# Patient Record
Sex: Male | Born: 1955 | Race: White | Hispanic: No | Marital: Married | Smoking: Former smoker
Health system: Southern US, Community
[De-identification: ages and names within clinical notes are randomized; demographics above are authoritative.]

## PROBLEM LIST (undated history)

## (undated) DIAGNOSIS — K589 Irritable bowel syndrome without diarrhea: Secondary | ICD-10-CM

## (undated) DIAGNOSIS — K219 Gastro-esophageal reflux disease without esophagitis: Secondary | ICD-10-CM

## (undated) DIAGNOSIS — E785 Hyperlipidemia, unspecified: Secondary | ICD-10-CM

## (undated) DIAGNOSIS — F329 Major depressive disorder, single episode, unspecified: Secondary | ICD-10-CM

## (undated) DIAGNOSIS — A770 Spotted fever due to Rickettsia rickettsii: Secondary | ICD-10-CM

## (undated) DIAGNOSIS — F32A Depression, unspecified: Secondary | ICD-10-CM

## (undated) DIAGNOSIS — D649 Anemia, unspecified: Secondary | ICD-10-CM

## (undated) DIAGNOSIS — I10 Essential (primary) hypertension: Secondary | ICD-10-CM

## (undated) HISTORY — DX: Spotted fever due to Rickettsia rickettsii: A77.0

## (undated) HISTORY — DX: Gastro-esophageal reflux disease without esophagitis: K21.9

## (undated) HISTORY — DX: Hyperlipidemia, unspecified: E78.5

## (undated) HISTORY — DX: Anemia, unspecified: D64.9

## (undated) HISTORY — PX: CHOLECYSTECTOMY: SHX55

## (undated) HISTORY — DX: Irritable bowel syndrome, unspecified: K58.9

---

## 1999-01-31 ENCOUNTER — Emergency Department (HOSPITAL_COMMUNITY): Admission: EM | Admit: 1999-01-31 | Discharge: 1999-01-31 | Payer: Self-pay | Admitting: Emergency Medicine

## 2003-03-24 ENCOUNTER — Encounter: Payer: Self-pay | Admitting: Gastroenterology

## 2003-09-30 ENCOUNTER — Emergency Department (HOSPITAL_COMMUNITY): Admission: EM | Admit: 2003-09-30 | Discharge: 2003-09-30 | Payer: Self-pay

## 2005-10-11 ENCOUNTER — Emergency Department (HOSPITAL_COMMUNITY): Admission: EM | Admit: 2005-10-11 | Discharge: 2005-10-12 | Payer: Self-pay | Admitting: Emergency Medicine

## 2009-05-24 ENCOUNTER — Emergency Department (HOSPITAL_COMMUNITY): Admission: EM | Admit: 2009-05-24 | Discharge: 2009-05-24 | Payer: Self-pay | Admitting: Emergency Medicine

## 2009-05-29 ENCOUNTER — Emergency Department (HOSPITAL_COMMUNITY): Admission: EM | Admit: 2009-05-29 | Discharge: 2009-05-29 | Payer: Self-pay | Admitting: Emergency Medicine

## 2009-05-30 ENCOUNTER — Emergency Department (HOSPITAL_COMMUNITY): Admission: EM | Admit: 2009-05-30 | Discharge: 2009-05-30 | Payer: Self-pay | Admitting: Emergency Medicine

## 2010-03-18 ENCOUNTER — Ambulatory Visit: Payer: Self-pay | Admitting: Physician Assistant

## 2010-03-18 DIAGNOSIS — K219 Gastro-esophageal reflux disease without esophagitis: Secondary | ICD-10-CM

## 2010-03-18 DIAGNOSIS — K589 Irritable bowel syndrome without diarrhea: Secondary | ICD-10-CM | POA: Insufficient documentation

## 2010-03-18 DIAGNOSIS — R252 Cramp and spasm: Secondary | ICD-10-CM

## 2010-03-18 DIAGNOSIS — L290 Pruritus ani: Secondary | ICD-10-CM | POA: Insufficient documentation

## 2010-03-18 DIAGNOSIS — E785 Hyperlipidemia, unspecified: Secondary | ICD-10-CM | POA: Insufficient documentation

## 2010-03-19 ENCOUNTER — Telehealth: Payer: Self-pay | Admitting: Physician Assistant

## 2010-03-19 ENCOUNTER — Encounter: Payer: Self-pay | Admitting: Physician Assistant

## 2010-03-19 LAB — CONVERTED CEMR LAB
ALT: 17 units/L (ref 0–53)
AST: 19 units/L (ref 0–37)
Albumin: 4.8 g/dL (ref 3.5–5.2)
Alkaline Phosphatase: 50 units/L (ref 39–117)
Amphetamine Screen, Ur: NEGATIVE
BUN: 22 mg/dL (ref 6–23)
Barbiturate Quant, Ur: NEGATIVE
Basophils Absolute: 0 10*3/uL (ref 0.0–0.1)
Basophils Relative: 1 % (ref 0–1)
Benzodiazepines.: NEGATIVE
CO2: 25 meq/L (ref 19–32)
Calcium: 10.3 mg/dL (ref 8.4–10.5)
Chloride: 105 meq/L (ref 96–112)
Cholesterol, target level: 200 mg/dL
Cholesterol: 229 mg/dL — ABNORMAL HIGH (ref 0–200)
Cocaine Metabolites: NEGATIVE
Creatinine, Ser: 0.96 mg/dL (ref 0.40–1.50)
Creatinine,U: 133.7 mg/dL
Eosinophils Absolute: 0.2 10*3/uL (ref 0.0–0.7)
Eosinophils Relative: 3 % (ref 0–5)
Glucose, Bld: 89 mg/dL (ref 70–99)
HCT: 44.1 % (ref 39.0–52.0)
HDL goal, serum: 40 mg/dL
HDL: 55 mg/dL (ref >39)
Hemoglobin: 14.4 g/dL (ref 13.0–17.0)
LDL Cholesterol: 147 mg/dL — ABNORMAL HIGH (ref 0–99)
LDL Goal: 160 mg/dL
Lymphocytes Relative: 35 % (ref 12–46)
Lymphs Abs: 2.2 10*3/uL (ref 0.7–4.0)
MCHC: 32.7 g/dL (ref 30.0–36.0)
MCV: 99.5 fL (ref 78.0–100.0)
Marijuana Metabolite: POSITIVE — AB
Methadone: NEGATIVE
Monocytes Absolute: 0.6 10*3/uL (ref 0.1–1.0)
Monocytes Relative: 10 % (ref 3–12)
Neutro Abs: 3.2 10*3/uL (ref 1.7–7.7)
Neutrophils Relative %: 51 % (ref 43–77)
Opiate Screen, Urine: NEGATIVE
PSA: 0.29 ng/mL (ref 0.10–4.00)
Phencyclidine (PCP): NEGATIVE
Platelets: 222 10*3/uL (ref 150–400)
Potassium: 5.3 meq/L (ref 3.5–5.3)
Propoxyphene: NEGATIVE
RBC: 4.43 M/uL (ref 4.22–5.81)
RDW: 12.9 % (ref 11.5–15.5)
Sodium: 141 meq/L (ref 135–145)
TSH: 1.822 microintl units/mL (ref 0.350–4.500)
Total Bilirubin: 0.9 mg/dL (ref 0.3–1.2)
Total CHOL/HDL Ratio: 4.2
Total Protein: 7.4 g/dL (ref 6.0–8.3)
Triglycerides: 137 mg/dL (ref <150)
VLDL: 27 mg/dL (ref 0–40)
WBC: 6.1 10*3/uL (ref 4.0–10.5)

## 2010-03-26 ENCOUNTER — Encounter: Payer: Self-pay | Admitting: Physician Assistant

## 2010-04-08 ENCOUNTER — Ambulatory Visit: Payer: Self-pay | Admitting: Physician Assistant

## 2010-04-08 DIAGNOSIS — M5430 Sciatica, unspecified side: Secondary | ICD-10-CM | POA: Insufficient documentation

## 2010-04-08 DIAGNOSIS — M25569 Pain in unspecified knee: Secondary | ICD-10-CM

## 2010-04-08 DIAGNOSIS — K648 Other hemorrhoids: Secondary | ICD-10-CM | POA: Insufficient documentation

## 2010-04-08 DIAGNOSIS — M25539 Pain in unspecified wrist: Secondary | ICD-10-CM | POA: Insufficient documentation

## 2010-04-08 LAB — CONVERTED CEMR LAB
Bilirubin Urine: NEGATIVE
Blood in Urine, dipstick: NEGATIVE
Glucose, Urine, Semiquant: NEGATIVE
Ketones, urine, test strip: NEGATIVE
Nitrite: NEGATIVE
Protein, U semiquant: NEGATIVE
Rapid HIV Screen: NEGATIVE
Specific Gravity, Urine: 1.025
Urobilinogen, UA: 0.2
WBC Urine, dipstick: NEGATIVE
pH: 5

## 2010-06-08 ENCOUNTER — Emergency Department (HOSPITAL_COMMUNITY): Admission: EM | Admit: 2010-06-08 | Discharge: 2010-06-08 | Payer: Self-pay | Admitting: Emergency Medicine

## 2010-07-11 ENCOUNTER — Ambulatory Visit: Payer: Self-pay | Admitting: Physician Assistant

## 2010-07-11 LAB — CONVERTED CEMR LAB
Bilirubin Urine: NEGATIVE
Blood in Urine, dipstick: NEGATIVE
Glucose, Urine, Semiquant: NEGATIVE
Ketones, urine, test strip: NEGATIVE
Nitrite: NEGATIVE
Protein, U semiquant: NEGATIVE
Specific Gravity, Urine: >=1.03
Urobilinogen, UA: 0.2
WBC Urine, dipstick: NEGATIVE
pH: 5

## 2010-07-17 LAB — CONVERTED CEMR LAB: Tissue Transglutaminase Ab, IgA: 6.7 units (ref <20)

## 2010-08-15 ENCOUNTER — Ambulatory Visit: Payer: Self-pay | Admitting: Physician Assistant

## 2010-09-30 ENCOUNTER — Ambulatory Visit: Payer: Self-pay | Admitting: Nurse Practitioner

## 2010-09-30 ENCOUNTER — Telehealth (INDEPENDENT_AMBULATORY_CARE_PROVIDER_SITE_OTHER): Payer: Self-pay | Admitting: Nurse Practitioner

## 2010-09-30 DIAGNOSIS — F411 Generalized anxiety disorder: Secondary | ICD-10-CM | POA: Insufficient documentation

## 2010-11-06 ENCOUNTER — Encounter (INDEPENDENT_AMBULATORY_CARE_PROVIDER_SITE_OTHER): Payer: Self-pay | Admitting: *Deleted

## 2010-12-17 ENCOUNTER — Encounter (INDEPENDENT_AMBULATORY_CARE_PROVIDER_SITE_OTHER): Payer: Self-pay | Admitting: *Deleted

## 2010-12-17 ENCOUNTER — Other Ambulatory Visit: Payer: Self-pay | Admitting: Gastroenterology

## 2010-12-17 ENCOUNTER — Ambulatory Visit
Admission: RE | Admit: 2010-12-17 | Discharge: 2010-12-17 | Payer: Self-pay | Source: Home / Self Care | Attending: Gastroenterology | Admitting: Gastroenterology

## 2010-12-17 LAB — CBC WITH DIFFERENTIAL/PLATELET
Basophils Absolute: 0 10*3/uL (ref 0.0–0.1)
Basophils Relative: 0.4 % (ref 0.0–3.0)
Eosinophils Absolute: 0.1 10*3/uL (ref 0.0–0.7)
Eosinophils Relative: 1.3 % (ref 0.0–5.0)
HCT: 39.8 % (ref 39.0–52.0)
Hemoglobin: 14 g/dL (ref 13.0–17.0)
Lymphocytes Relative: 37.6 % (ref 12.0–46.0)
Lymphs Abs: 2.5 10*3/uL (ref 0.7–4.0)
MCHC: 35.2 g/dL (ref 30.0–36.0)
MCV: 97.4 fl (ref 78.0–100.0)
Monocytes Absolute: 0.6 10*3/uL (ref 0.1–1.0)
Monocytes Relative: 8.5 % (ref 3.0–12.0)
Neutro Abs: 3.4 10*3/uL (ref 1.4–7.7)
Neutrophils Relative %: 52.2 % (ref 43.0–77.0)
Platelets: 225 10*3/uL (ref 150.0–400.0)
RBC: 4.09 Mil/uL — ABNORMAL LOW (ref 4.22–5.81)
RDW: 12.9 % (ref 11.5–14.6)
WBC: 6.6 10*3/uL (ref 4.5–10.5)

## 2010-12-17 LAB — BASIC METABOLIC PANEL
BUN: 19 mg/dL (ref 6–23)
CO2: 31 mEq/L (ref 19–32)
Calcium: 9.7 mg/dL (ref 8.4–10.5)
Chloride: 106 mEq/L (ref 96–112)
Creatinine, Ser: 1.1 mg/dL (ref 0.4–1.5)
GFR: 74.88 mL/min (ref >60.00)
Glucose, Bld: 88 mg/dL (ref 70–99)
Potassium: 4.5 mEq/L (ref 3.5–5.1)
Sodium: 142 mEq/L (ref 135–145)

## 2010-12-17 LAB — TSH: TSH: 1.88 u[IU]/mL (ref 0.35–5.50)

## 2010-12-18 ENCOUNTER — Encounter: Payer: Self-pay | Admitting: Gastroenterology

## 2010-12-19 ENCOUNTER — Telehealth: Payer: Self-pay | Admitting: Gastroenterology

## 2010-12-19 NOTE — Progress Notes (Signed)
Summary: GI referral  Phone Note Outgoing Call   Summary of Call: schedule pt with GI - indication irritable bowel Initial call taken by: Lehman Prom FNP,  September 30, 2010 10:35 AM  Follow-up for Phone Call        SEND A REFERRAL TO [facility] WAITING FOR AN APPT THEY WILL CONTACT THE PT  Follow-up by: Cheryll Dessert,  September 30, 2010 11:35 AM

## 2010-12-19 NOTE — Letter (Signed)
Summary: New Patient letter  [facility]  [address]   [place], [place] [address]   Phone: [phone]  Fax: [phone]       11/06/2010 MRN: [id number]  Shane Reynolds [address] [place], [place]  [address]  Dear Mr. Schwanz,  Welcome to the Gastroenterology Division at [facility].    You are scheduled to see Dr.  Christella Hartigan on 12/17/2010 at 2:00 on the 3rd floor at [facility], [address]  We ask that you try to arrive at our office 15 minutes prior to your appointment time to allow for check-in.  We would like you to complete the enclosed self-administered evaluation form prior to your visit and bring it with you on the day of your appointment.  We will review it with you.  Also, please bring a complete list of all your medications or, if you prefer, bring the medication bottles and we will list them.  Please bring your insurance card so that we may make a copy of it.  If your insurance requires a referral to see a specialist, please bring your referral form from your primary care physician.  Co-payments are due at the time of your visit and may be paid by cash, check or credit card.     Your office visit will consist of a consult with your physician (includes a physical exam), any laboratory testing he/she may order, scheduling of any necessary diagnostic testing (e.g. x-ray, ultrasound, CT-scan), and scheduling of a procedure (e.g. Endoscopy, Colonoscopy) if required.  Please allow enough time on your schedule to allow for any/all of these possibilities.    If you cannot keep your appointment, please call [phone] to cancel or reschedule prior to your appointment date.  This allows [place] the opportunity to schedule an appointment for another patient in need of care.  If you do not cancel or reschedule by 5 p.m. the business day prior to your appointment date, you will be charged a $50.00 late cancellation/no-show fee.    Thank you for  choosing [facility] for your medical needs.  We appreciate the opportunity to care for you.  Please visit [place] at our website  to learn more about our practice.                     Sincerely,                                                             The Gastroenterology Division

## 2010-12-19 NOTE — Progress Notes (Signed)
Summary: Office Visit//DEPRESSION SCREENING  Office Visit//DEPRESSION SCREENING   Imported By: Arta Bruce 05/27/2010 11:50:09  _____________________________________________________________________  External Attachment:    Type:   Image     Comment:   External Document

## 2010-12-19 NOTE — Progress Notes (Signed)
  Phone Note Call from Patient Call back at [facility] Phone [phone]   Summary of Call: The pt needs to get his lab results done yesterday. Alben Spittle PA-c Initial call taken by: Manon Hilding,  Mar 19, 2010 10:27 AM  Follow-up for Phone Call        spoke with pt and let him know tha labs results has not been looked at by provider but as soon as i know the results we will let him know by phone or letter..... forward to provider Follow-up by: Armenia Shannon,  Mar 19, 2010 10:32 AM  Additional Follow-up for Phone Call Additional follow up Details #1::        all labs are normal total chol high but rest of numbers are good lipid letter done to be mailed Additional Follow-up by: Brynda Rim,  Mar 19, 2010 5:49 PM    Additional Follow-up for Phone Call Additional follow up Details #2::    pt is aware Follow-up by: Armenia Shannon,  Mar 20, 2010 9:39 AM

## 2010-12-19 NOTE — Assessment & Plan Note (Signed)
Summary: NEW MEDICAID TO ESTB//[place]   Vital Signs:  Patient profile:   55 year old male Height:      74 inches Weight:      181 pounds BMI:     23.32 Pulse rate:   52 / minute Pulse rhythm:   regular BP sitting:   127 / 71  (left arm) Cuff size:   large  Vitals Entered By: Armenia Shannon (Mar 18, 2010 11:16 AM) CC: Np.... pt says he has skin problems... pt says its like a rash on his bottom.... pt has not tried anything... pt says his muscles draw up when he exertion Is Patient Diabetic? No Pain Assessment Patient in pain? no       Does patient need assistance? Functional Status Self care Ambulation Normal   Primary Care Provider:  Tereso Newcomer, PA-C  CC:  Np.... pt says he has skin problems... pt says its like a rash on his bottom.... pt has not tried anything... pt says his muscles draw up when he exertion.  History of Present Illness: New patient. Apparently was a patient here some years ago.  Had been told he had Hep C and was referred to [facility].  This was about 6 years ago.  Workup at [facility] was apparently negative. He was seeing Dr. Perrin Maltese previously [facility] [address].  Mainly was seen for IBS.  GERD:  Has been taking Zantac for about 6 mos.  Dr. at Urgent Care put him on it.  May have helped some.  Complains mainly of gas.  Has some constipation and then diarrhea.  Has not used SSRI in the past.  Does try to eat a lot of fiber.  Dyspepsia has improved since using zantac.  Does not have many symptoms.    Also notes rash in perirectal area.  Has some itching noted from time to time.  Feels like there is a bump around his rectum when he scratches.  No bleeding.    Also, notes occ. muscle cramping.  Works [employer] at [employer].  Seems to be worse after working.  Trys to drink plenty of water.  Has increased dyspepsia with Gatorade.    Continues to offer more and more complaints today.  Explained that we would have to address other complaints at a separate visit.   Habits &  Providers  Alcohol-Tobacco-Diet     Alcohol drinks/day: 0     Tobacco Status: quit     Year Quit: 2010     Pack years: 10  Exercise-Depression-Behavior     Drug Use: no  Current Medications (verified): 1)  Zantac 300 Mg Tabs (Ranitidine Hcl) .... One Tab By Mouth Two Times Daily  Allergies (verified): 1)  ! Codeine  Past History:  Past Medical History: Irritable Bowel Syndrome ? h/o + Hep C antibody with negative w/u at [facility] 2005 GERD Hyperlipidemia  a. previously on meds  Past Surgical History: Cholecystectomy (1998) Colonoscopy 2005 [facility])  Family History: Family History Diabetes 1st degree relative - mom Stroke - Dad (died in 40s) Skin Cancer - sister No colon cancer or prostate cancer. 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 . .wife may be pregnant Former Smoker Alcohol use-no Drug use-no Occupation:  employed Smoking Status:  quit Drug Use:  no  Physical Exam  General:  alert, well-developed, and well-nourished.   Head:  normocephalic and atraumatic.   Eyes:  pupils equal, pupils  round, and pupils reactive to light.   Mouth:  pharynx pink and moist.   Neck:  supple, no thyromegaly, and no cervical lymphadenopathy.   Lungs:  normal breath sounds.   Heart:  normal rate and regular rhythm.   Abdomen:  soft, non-tender, normal bowel sounds, and no hepatomegaly.   Rectal:  no external abnormalities and no hemorrhoids.   Extremities:  no edema Neurologic:  alert & oriented X3 and cranial nerves II-XII intact.   Psych:  normally interactive and good eye contact.     Impression & Recommendations:  Problem # 1:  GERD (ICD-530.81) seems controlled rec he change to zantac 150 mg two times a day as needed  His updated medication list for this problem includes:    Zantac 150 Mg Tabs (Ranitidine hcl) .Marland Kitchen... Take 1 tablet by mouth two times a day as needed for  indigestion  Problem # 2:  IRRITABLE BOWEL SYNDROME (ICD-564.1) need copy of colo done at [facility] high fiber handout given trial of miralax   Problem # 3:  HYPERLIPIDEMIA (ICD-272.4)  check lipids today  Orders: T-Comprehensive Metabolic Panel ([address]-60454) T-Lipid Profile (09811-91478)  Problem # 4:  Preventive Health Care (ICD-V70.0)  fasting today get labs discussed PSA and he would like checked  Orders: T-Comprehensive Metabolic Panel (29562-13086) T-CBC w/Diff (57846-96295) T-TSH (28413-24401) T-PSA (02725-36644) T-HIV Antibody  (Reflex) [id number]) T-Syphilis Test (RPR) [phone]) T-Drug Screen-Urine, (single) (51884-16606) Hemoccult Cards -3 specimans (take home) (30160)  Problem # 5:  LEG CRAMPS (ICD-729.82)  likely related to muscle fatigue from his line of work check e-lytes rec stretching exercises could consider at bedtime muscle relaxer as needed  Orders: T-Comprehensive Metabolic Panel (10932-35573) T-TSH (22025-42706)  Problem # 6:  PRURITUS ANI (ICD-698.0) suspect related to irritation from altered bowel habits with IBS anusol as needed  Complete Medication List: 1)  Zantac 150 Mg Tabs (Ranitidine hcl) .... Take 1 tablet by mouth two times a day as needed for indigestion 2)  Miralax Powd (Polyethylene glycol 3350) .... Dissolve one capful in 8 oz glass of water and drink one glass a day 3)  Anusol-hc 2.5 % Crea (Hydrocortisone) .... Apply once daily as needed  Patient Instructions: 1)  Get record of Colonoscopy done at [facility] in 2005 or 2006. 2)  Please schedule a follow-up appointment in 2-3 months with Scott for CPE.  3)  Drink plenty of fluids. 4)  Try to drink Gatorade or Powerade to help with cramps. 5)  Make sure you do stretching excercises before and after work to help with cramps. 6)  Use Anusol as needed. 7)  Use Zantac as needed. 8)  Try Miralax to see if this helps with your bowels. Prescriptions: ANUSOL-HC 2.5 % CREA  (HYDROCORTISONE) apply once daily as needed  #30 grams x 1   Entered and Authorized by:   Tereso Newcomer PA-C   Signed by:   Tereso Newcomer PA-C on 03/18/2010   Method used:   Print then Give to Patient   RxID:   [id number] MIRALAX  POWD (POLYETHYLENE GLYCOL 3350) dissolve one capful in 8 oz glass of water and drink one glass a day  #1 bottle x 4   Entered and Authorized by:   Tereso Newcomer PA-C   Signed by:   Tereso Newcomer PA-C on 03/18/2010   Method used:   Print then Give to Patient   RxID:   [id number] ZANTAC 150 MG TABS (RANITIDINE HCL) Take 1 tablet by mouth two times a day as needed  for indigestion  #60 x 3   Entered and Authorized by:   Tereso Newcomer PA-C   Signed by:   Tereso Newcomer PA-C on 03/18/2010   Method used:   Print then Give to Patient   RxID:   [id number]

## 2010-12-19 NOTE — Letter (Signed)
Summary: Handout Printed  Printed Handout:  - Diet - High-Fiber 

## 2010-12-19 NOTE — Assessment & Plan Note (Signed)
Summary: GERD   Vital Signs:  Patient profile:   55 year old male Height:      74 inches Weight:      190.3 pounds BMI:     24.52 Temp:     98.1 degrees F oral Pulse rate:   66 / minute Pulse rhythm:   regular Resp:     18 per minute BP sitting:   130 / 78  (left arm) Cuff size:   large  Vitals Entered By: Armenia Shannon (August 15, 2010 9:04 AM) CC: f/u... pt wants flu shot.., Abdominal Pain Is Patient Diabetic? No Pain Assessment Patient in pain? no       Does patient need assistance? Functional Status Self care Ambulation Normal   Primary Care Dhwani Venkatesh:  Tereso Newcomer, PA-C  CC:  f/u... pt wants flu shot.. and Abdominal Pain.  History of Present Illness: Here for f/u. Not using Levsin.  Cramping and diarrhea better with PPI. Less indigestion.  Still having some breakthrough symptoms and using pepton 2x a week.   Dyspepsia History:      He has no alarm features of dyspepsia including no history of melena, hematochezia, dysphagia, persistent vomiting, or involuntary weight loss > 5%.  There is a prior history of GERD.  The patient does not have a prior history of documented ulcer disease.  He has no history of a positive H. Pylori serology.  No previous upper endoscopy has been done.     Current Medications (verified): 1)  Miralax  Powd (Polyethylene Glycol 3350) .... Dissolve One Capful in 8 Oz Glass of Water and Drink One Glass A Day 2)  Anusol-Hc 2.5 % Crea (Hydrocortisone) .... Apply Once Daily As Needed 3)  Levsin/sl 0.125 Mg Subl (Hyoscyamine Sulfate) .... One Under Tongue Every 4 Hours As Needed For Colon Spasm or Diarrhea 4)  Nexium 40 Mg Cpdr (Esomeprazole Magnesium) .... Take 1 Tablet By Mouth Once A Day  Allergies (verified): 1)  ! Codeine  Past History:  Past Medical History: Reviewed history from 04/08/2010 and no changes required. Irritable Bowel Syndrome h/o + Hep C antibody with negative w/u at [facility] 2005   a.  viral load negative at that  time. . . no further w/u needed GERD Hyperlipidemia  a. previously on meds H/o genital herpes  Past Surgical History: Reviewed history from 03/18/2010 and no changes required. Cholecystectomy (1998) Colonoscopy 2005 [facility])  Physical Exam  General:  alert, well-developed, and well-nourished.   Head:  normocephalic and atraumatic.   Neck:  supple.   Lungs:  normal breath sounds.   Heart:  normal rate and regular rhythm.   Abdomen:  soft, normal bowel sounds, and epigastric tenderness.   Neurologic:  alert & oriented X3 and cranial nerves II-XII intact.   Psych:  good eye contact.     Impression & Recommendations:  Problem # 1:  GERD (ICD-530.81) Assessment Improved still having breakthrough symptoms 2x a week taking pepto increase PPI to two times a day  f/u 6-8 weeks if still having breakthrough symptoms will need to refer to GI  His updated medication list for this problem includes:    Levsin/sl 0.125 Mg Subl (Hyoscyamine sulfate) ..... One under tongue every 4 hours as needed for colon spasm or diarrhea    Nexium 40 Mg Cpdr (Esomeprazole magnesium) .Marland Kitchen... Take 1 tablet by mouth two times a day (dose increased)  Problem # 2:  IRRITABLE BOWEL SYNDROME (ICD-564.1) Assessment: Improved not even taking levsin seems to be  better with PPI  Problem # 3:  PREVENTIVE HEALTH CARE (ICD-V70.0) flu shot today  Complete Medication List: 1)  Miralax Powd (Polyethylene glycol 3350) .... Dissolve one capful in 8 oz glass of water and drink one glass a day 2)  Anusol-hc 2.5 % Crea (Hydrocortisone) .... Apply once daily as needed 3)  Levsin/sl 0.125 Mg Subl (Hyoscyamine sulfate) .... One under tongue every 4 hours as needed for colon spasm or diarrhea 4)  Nexium 40 Mg Cpdr (Esomeprazole magnesium) .... Take 1 tablet by mouth two times a day (dose increased)  Patient Instructions: 1)  Increase Nexium to two times a day. 2)  Schedule follow up in 6-8 weeks for stomach. 3)   Return sooner is symptoms get worse. 4)  Flu shot today. Prescriptions: NEXIUM 40 MG CPDR (ESOMEPRAZOLE MAGNESIUM) Take 1 tablet by mouth two times a day (dose increased)  #60 x 4   Entered and Authorized by:   Tereso Newcomer PA-C   Signed by:   Tereso Newcomer PA-C on 08/15/2010   Method used:   Print then Give to Patient   RxID:   [id number]

## 2010-12-19 NOTE — Letter (Signed)
Summary: Lipid Letter  [facility]  [address] [place], [place] [address]   Phone: [phone]  Fax: [phone]    03/19/2010  Shane Reynolds [address] [place], [place]  [address]  Dear Shane Reynolds:  Here are your lipid results:    Cholesterol:       229     Goal: <200   HDL "good" Cholesterol:   55     Goal: >40   LDL "bad" Cholesterol:   147     Goal: <160   Triglycerides:       137     Goal: <150   All numbers at goal except the total cholesterol.  See the recommendations below.  We will need to check your cholesterol again in one year.    TLC Diet (Therapeutic Lifestyle Change): Saturated Fats & Transfatty acids should be kept < 7% of total calories ***Reduce Saturated Fats Polyunstaurated Fat can be up to 10% of total calories Monounsaturated Fat Fat can be up to 20% of total calories Total Fat should be no greater than 25-35% of total calories Carbohydrates should be 50-60% of total calories Protein should be approximately 15% of total calories Fiber should be at least 20-30 grams a day ***Increased fiber may help lower LDL Total Cholesterol should be < 200mg /day Consider adding plant stanol/sterols to diet (example: Benacol spread) ***A higher intake of unsaturated fat may reduce Triglycerides and Increase HDL    Adjunctive Measures (may lower LIPIDS and reduce risk of Heart Attack) include: Aerobic Exercise (20-30 minutes 3-4 times a week) Limit Alcohol Consumption Weight Reduction Dietary Fiber 20-30 grams a day by mouth    Current Medications: 1)    Zantac 150 Mg Tabs (Ranitidine hcl) .... Take 1 tablet by mouth two times a day as needed for indigestion 2)    Miralax  Powd (Polyethylene glycol 3350) .... Dissolve one capful in 8 oz glass of water and drink one glass a day 3)    Anusol-hc 2.5 % Crea (Hydrocortisone) .... Apply once daily as needed  If you have any questions, please call.   Sincerely,    Tereso Newcomer, PA-C Tereso Newcomer PA-C

## 2010-12-19 NOTE — Miscellaneous (Signed)
  Clinical Lists Changes  Observations: Added new observation of PAST MED HX: Irritable Bowel Syndrome h/o + Hep C antibody with negative w/u at [facility] 2005   a.  viral load negative at that time. . . no further w/u needed GERD Hyperlipidemia  a. previously on meds (04/08/2010 13:39) Added new observation of COLONRECACT: Repeat colonoscopy in 10 years.   (03/24/2003 13:40) Added new observation of COLONOSCOPY:  Results: Hemorrhoids.      (03/24/2003 13:40)      Colonoscopy  Procedure date:  03/24/2003  Findings:       Results: Hemorrhoids.       Comments:      Repeat colonoscopy in 10 years.      Past History:  Past Medical History: Irritable Bowel Syndrome h/o + Hep C antibody with negative w/u at [facility] 2005   a.  viral load negative at that time. . . no further w/u needed GERD Hyperlipidemia  a. previously on meds

## 2010-12-19 NOTE — Letter (Signed)
Summary: REQUESTING RECORDS FROM [facility]  REQUESTING RECORDS FROM [facility]   Imported By: Arta Bruce 04/08/2010 12:30:32  _____________________________________________________________________  External Attachment:    Type:   Image     Comment:   External Document

## 2010-12-19 NOTE — Assessment & Plan Note (Signed)
Summary: Abdominal problems   Vital Signs:  Patient profile:   55 year old male Weight:      182.5 pounds BMI:     23.52 Temp:     97.1 degrees F oral Pulse rate:   70 / minute Pulse rhythm:   regular Resp:     16 per minute BP sitting:   140 / 94  (left arm) Cuff size:   large  Vitals Entered By: Levon Hedger (September 30, 2010 10:00 AM) CC: stomach issue with diarrhea causing low back pain...wants to know symptoms of Chron's, Abdominal Pain Is Patient Diabetic? No Pain Assessment Patient in pain? yes     Location: stomach Intensity: 2-3 Type: dull, aching  Does patient need assistance? Functional Status Self care Ambulation Normal Comments pt states he is taking Nexium once a day.   Primary Care Provider:  Tereso Newcomer, PA-C  CC:  stomach issue with diarrhea causing low back pain...wants to know symptoms of Chron's and Abdominal Pain.  History of Present Illness:  Pt into the office still with f/u on abdominal pain. Previous provider was [facility]. Pt was advised to start taking nexium two times a day.  However he has only been taking before supper as he only eats once per day. Pt needs clarity as to what nexium is for and why he needs to take it Ongoing problems with stomach - intermittent.  "I am normal for a couple of day then loose bowels and sometimes constipation. Decrease energy  Bil leg cramps  Takes metamucil two times a day for fiber supplement no caffiene no carbonated drinks No starchy foods.  Colonscopy - last done in 2004; normal results per pt  Dyspepsia History:      He has no alarm features of dyspepsia including no history of melena, hematochezia, dysphagia, persistent vomiting, or involuntary weight loss > 5%.  There is a prior history of GERD.  The patient does not have a prior history of documented ulcer disease.  The dominant symptom is heartburn or acid reflux.  An H-2 blocker medication is not currently being taken.  No previous  upper endoscopy has been done.     Habits & Providers  Alcohol-Tobacco-Diet     Alcohol drinks/day: 0     Tobacco Status: quit     Tobacco Counseling: to remain off tobacco products     Year Quit: 2010     Pack years: 10  Exercise-Depression-Behavior     Drug Use: no  Allergies (verified): 1)  ! Codeine  Review of Systems General:  Complains of weakness. CV:  Denies chest pain or discomfort. Resp:  Denies cough. GI:  Complains of constipation, diarrhea, and indigestion; denies nausea and vomiting; intermittent symptoms. MS:  Complains of muscle and cramps; right leg. Psych:  Complains of anxiety.  Physical Exam  General:  alert.   Head:  normocephalic.   Lungs:  normal breath sounds.   Heart:  normal rate and regular rhythm.   Neurologic:  alert & oriented X3.   Skin:  color normal.   Psych:  Oriented X3.     Impression & Recommendations:  Problem # 1:  IRRITABLE BOWEL SYNDROME (ICD-564.1)  diet restrictions are still not helpful will refer to GI  Orders: Gastroenterology Referral (GI)  Problem # 2:  GERD (ICD-530.81)  advised pt to take nexium in the morning small meals per day His updated medication list for this problem includes:    Levsin/sl 0.125 Mg Subl (Hyoscyamine sulfate) .Marland KitchenMarland KitchenMarland KitchenMarland Kitchen  One under tongue every 4 hours as needed for colon spasm or diarrhea    Nexium 40 Mg Cpdr (Esomeprazole magnesium) .Marland Kitchen... Take 1 tablet by mouth two times a day (dose increased)  Orders: Gastroenterology Referral (GI)  Problem # 3:  ANXIETY (ICD-300.00) advise pt that if GI f/u is normal then he may need to consider starting medications for anxiety  Complete Medication List: 1)  Miralax Powd (Polyethylene glycol 3350) .... Dissolve one capful in 8 oz glass of water and drink one glass a day 2)  Anusol-hc 2.5 % Crea (Hydrocortisone) .... Apply once daily as needed 3)  Levsin/sl 0.125 Mg Subl (Hyoscyamine sulfate) .... One under tongue every 4 hours as needed for colon spasm  or diarrhea 4)  Nexium 40 Mg Cpdr (Esomeprazole magnesium) .... Take 1 tablet by mouth two times a day (dose increased) 5)  Amitiza 8 Mcg Caps (Lubiprostone) .... One capsule by mouth two times a day  Patient Instructions: 1)  Nexium - take this in the morning - even though you don't eat until te afternoon.   2)  You will be referred to GI for follow up on bowel problems. 3)  Start amitiza by mouth two times a day (with a small amount of low fat food) 4)  Keep in the back of your mind that anxiety may be causing symptoms 5)  Read handouts on chrohns and diverticulosis Prescriptions: AMITIZA 8 MCG CAPS (LUBIPROSTONE) ONe capsule by mouth two times a day  #16 x 0   Entered and Authorized by:   Lehman Prom FNP   Signed by:   Lehman Prom FNP on 09/30/2010   Method used:   Samples Given   RxID:   [id number]    Orders Added: 1)  Est. Patient Level III [[address]] 2)  Gastroenterology Referral [GI]

## 2010-12-19 NOTE — Assessment & Plan Note (Signed)
Summary: FU FOR CPE////KT   Vital Signs:  Patient profile:   55 year old male Height:      74 inches Weight:      183 pounds BMI:     23.58 Temp:     98.4 degrees F oral Pulse rate:   50 / minute Pulse rhythm:   regular Resp:     18 per minute BP sitting:   114 / 75  (left arm) Cuff size:   large  Vitals Entered By: Armenia Shannon (Apr 08, 2010 2:46 PM) CC: cpe... pt says he has pain in his right leg and hand.. Is Patient Diabetic? No Pain Assessment Patient in pain? no       Does patient need assistance? Functional Status Self care Ambulation Normal   Primary Care Provider:  Tereso Newcomer, PA-C  CC:  cpe... pt says he has pain in his right leg and hand...  History of Present Illness: Here for CPE.  Had labs at last visit.  All labs ok except total chol somewhat elevated.  Lipid letter sent.  Never rec'd. Reviewed prior chart. Had colo in 2004.  Needs repeat in 2014. PSA was ok. Hep C workup at [facility] was notable for negative viral load.  No further w/u rec. PHQ9=3 today.  Notes several tick bites through the years.  Wants to be checked for Lyme disease.  Has bilateral knee pain.  Has had for 4-5 years.  Stiff in the am.  No significant change with weather.   Also, notes wrist pain on right.  Has had for 4-5 days.  Works in Aeronautical engineer.  Does not think he injured it.  Notes some decreased range of motion with limited wrist extension.   Problems Prior to Update: 1)  Sciatica  (ICD-724.3) 2)  Tick Bite  (ICD-E906.4) 3)  Hemorrhoids, Internal  (ICD-455.0) 4)  Knee Pain, Bilateral  (ICD-719.46) 5)  Wrist Pain, Right  (ICD-719.43) 6)  Preventive Health Care  (ICD-V70.0) 7)  Pruritus Ani  (ICD-698.0) 8)  Leg Cramps  (ICD-729.82) 9)  Family History of Alcoholism/addiction  (ICD-V61.41) 10)  Family History Diabetes 1st Degree Relative  (ICD-V18.0) 11)  Hyperlipidemia  (ICD-272.4) 12)  Gerd  (ICD-530.81) 13)  Irritable Bowel Syndrome  (ICD-564.1)  Current  Medications (verified): 1)  Zantac 150 Mg Tabs (Ranitidine Hcl) .... Take 1 Tablet By Mouth Two Times A Day As Needed For Indigestion 2)  Miralax  Powd (Polyethylene Glycol 3350) .... Dissolve One Capful in 8 Oz Glass of Water and Drink One Glass A Day 3)  Anusol-Hc 2.5 % Crea (Hydrocortisone) .... Apply Once Daily As Needed  Allergies (verified): 1)  ! Codeine  Past History:  Past Medical History: Irritable Bowel Syndrome h/o + Hep C antibody with negative w/u at [facility] 2005   a.  viral load negative at that time. . . no further w/u needed GERD Hyperlipidemia  a. previously on meds H/o genital herpes  Family History: Reviewed history from 03/18/2010 and no changes required. Family History Diabetes 1st degree relative - mom Stroke - Dad (died in 21s) Skin Cancer - sister No colon cancer or prostate cancer. Family History of Alcoholism/Addiction - mom  Social History: Reviewed history from 03/18/2010 and no changes required. 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 . .wife may be pregnant Former Smoker Alcohol use-no Drug use-no  Review of Systems      See HPI General:  Denies chills and fever. Resp:  Denies cough. GI:  Denies  dark tarry stools; has occ hemorrhoidal bleeding; has noted since his colo in 2004 occ. and is assoc with straining and constipation. GU:  Denies dysuria, hematuria, nocturia, and urinary frequency. MS:  See HPI. Derm:  Denies rash. Psych:  Denies suicidal thoughts/plans.  Physical Exam  General:  alert, well-developed, and well-nourished.   Head:  normocephalic and atraumatic.   Eyes:  pupils equal, pupils round, pupils reactive to light, and no retinal abnormalitiies.   Ears:  R ear normal and L ear normal.   Nose:  no external deformity.   Mouth:  pharynx pink and moist.   Neck:  supple, no thyromegaly, no carotid bruits, and no cervical lymphadenopathy.   Chest Wall:  no deformities.   Lungs:  normal breath sounds, no  crackles, and no wheezes.   Heart:  normal rate, regular rhythm, and no murmur.   Abdomen:  soft, non-tender, normal bowel sounds, and no hepatomegaly.   Rectal:  no external abnormalities.   Genitalia:  circumcised, no hydrocele, no varicocele, no scrotal masses, no testicular masses or atrophy, no cutaneous lesions, and no urethral discharge.   Prostate:  no gland enlargement, no nodules, no asymmetry, and no induration.   Msk:  right wrist with slightly limited extension no crepitus no deformity  bilat knees: + crepitus L>R no eff neg McMurray neg ant drawer neg lachman  Extremities:  no edema Neurologic:  alert & oriented X3 and cranial nerves II-XII intact.   Skin:  turgor normal.   Psych:  normally interactive and good eye contact.     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 . . will pursue xrays at that point  Problem # 3:  HEMORRHOIDS, INTERNAL (ICD-455.0) has occ hemorrhoidal bleeding has noted since he had his colo in 2004 no significant change continue to increase fiber and take miralax as needed use supp as needed  Problem # 4:  PREVENTIVE HEALTH CARE (ICD-V70.0)  up to date on all health maint areas  Orders: Hemoccult Cards -3 specimans (take home) (16109) Hemoccult Guaiac-1 spec.(in office) (82270)  Problem # 5:  TICK BITE (ICD-E906.4)  concerned about Lyme disease has had arthralgias as noted above that seem to be related to his line of work no rashes documented will get Lyme Ab  Orders: T-Lyme Disease ([id number])  Problem # 6:  GERD  (ICD-530.81) thinks zantac 300 two times a day was better he can increase to 300 two times a day as needed  His updated medication list for this problem includes:    Zantac 150 Mg Tabs (Ranitidine hcl) .Marland Kitchen... Take 1 tablet by mouth two times a day as needed for indigestion  Problem # 7:  SCIATICA (ICD-724.3) he notes this as well happens when sitting and relieves when he stands up   Complete Medication List: 1)  Zantac 150 Mg Tabs (Ranitidine hcl) .... Take 1 tablet by mouth two times a day as needed for indigestion 2)  Miralax Powd (Polyethylene glycol 3350) .... Dissolve one capful in 8 oz glass of water and drink one glass a day 3)  Anusol-hc 2.5 % Crea (Hydrocortisone) .Marland KitchenMarland KitchenMarland Kitchen  Apply once daily as needed  Patient Instructions: 1)  Take 650 - 1000 mg of tylenol every 4-6 hours as needed for relief of pain or comfort of fever. Avoid taking more than 4000 mg in a 24 hour period( can cause liver damage in higher doses).  2)  Tylenol is pretty effective for arthritis.  You may even try to take 1000 mg two times a day every day to prevent pain and take Ibuprofen in between.  Make sure you take ibuprofen with food. 3)  You can take Ibuprofen 600-800 mg three times a day for 3 days for your hip and wrist pain.  Take it with food. 4)  Get the xray for your wrist. 5)  You can put ice on it two times a day to three times a day. 6)  Try to take a few days off from work to rest your wrist. 7)  Follow up if your wrist does not get better. 8)  You should try to stay active for your knees. 9)  If your knee pain gets worse, schedule a follow up. 10)  If you have more bleeding that is unusual, follow up sooner. 11)  Continue to eat more fiber. 12)  Please schedule a follow-up appointment in 1 year with Elliemae Braman for CPE or sooner as needed.   Laboratory Results   Urine Tests  Date/Time Received: Apr 08, 2010 5:53 PM   Routine Urinalysis   Glucose: negative   (Normal Range: Negative) Bilirubin:  negative   (Normal Range: Negative) Ketone: negative   (Normal Range: Negative) Spec. Gravity: 1.025   (Normal Range: 1.003-1.035) Blood: negative   (Normal Range: Negative) pH: 5.0   (Normal Range: 5.0-8.0) Protein: negative   (Normal Range: Negative) Urobilinogen: 0.2   (Normal Range: 0-1) Nitrite: negative   (Normal Range: Negative) Leukocyte Esterace: negative   (Normal Range: Negative)    Date/Time Received: Mar 18, 2010 2:50 PM   Other Tests  Rapid HIV: negative

## 2010-12-19 NOTE — Assessment & Plan Note (Signed)
Summary: STOMACH ISSUE///KT   Vital Signs:  Patient profile:   55 year old male Height:      74 inches Weight:      186 pounds BMI:     23.97 Temp:     97.3 degrees F oral Pulse rate:   53 / minute Pulse rhythm:   regular Resp:     20 per minute BP sitting:   137 / 80  (left arm)  Vitals Entered By: Armenia Shannon (July 11, 2010 10:48 AM)  Primary Care Provider:  Tereso Newcomer, PA-C  CC:  pt went to er for stomach issues... pt says the zantac is not helping... pt says he is using metamuci.... pt says he wants inforamtion about the shingles vaccine....  History of Present Illness: Here for stomach problems. Taking metamucil two times a day and this keeps him regular. Will have normal bowel movements for a few days.  Then has sudden onset of loose stools and gas.  Has had this problem for years.  Had colo in 2004 at [facility].  Had hem's and rec to have repeat in 2014. Has a h/o IBS.  PHQ9 score was low at CPE in May 2011. Notes problems with indigestion.  Has 2-3 times a week.  No dysphagia.  No melena or hematochezia.   No weight loss.  No fevers or chills.  Has some leg pain when he gets diarrhea.   No caffeine, no cigs.  No spicy foods.  Tries to eat whole wheat and grains.   Went to ED in July with nausea and was dx with heat exhaustion.  Had normal CBC.  Also had lumbar spine xray for complaints of low back pain.   Habits & Providers  Exercise-Depression-Behavior     Have you felt down or hopeless? no     Have you felt little pleasure in things? no  Current Medications (verified): 1)  Zantac 150 Mg Tabs (Ranitidine Hcl) .... Take 1 Tablet By Mouth Two Times A Day As Needed For Indigestion 2)  Miralax  Powd (Polyethylene Glycol 3350) .... Dissolve One Capful in 8 Oz Glass of Water and Drink One Glass A Day 3)  Anusol-Hc 2.5 % Crea (Hydrocortisone) .... Apply Once Daily As Needed  Allergies (verified): 1)  ! Codeine  Past History:  Past Medical History: Last updated:  04/08/2010 Irritable Bowel Syndrome h/o + Hep C antibody with negative w/u at [facility] 2005   a.  viral load negative at that time. . . no further w/u needed GERD Hyperlipidemia  a. previously on meds H/o genital herpes  Physical Exam  General:  alert, well-developed, and well-nourished.   Head:  normocephalic and atraumatic.   Neck:  supple.   Lungs:  normal breath sounds, no crackles, and no wheezes.   Heart:  normal rate, regular rhythm, and no murmur.   Abdomen:  soft, normal bowel sounds, no hepatomegaly, and epigastric tenderness.   Neurologic:  alert & oriented X3 and cranial nerves II-XII intact.   Psych:  normally interactive.     Impression & Recommendations:  Problem # 1:  IRRITABLE BOWEL SYNDROME (ICD-564.1)  mainly has diarrhea denies significant depression relates low back pain and abdominal pain to his symptoms of diarrhea (?spasm) had xrays in ED in July that demonstrated DDD had colo in 2004 and has had these symptoms for years check for celiac sprue cut back on metamucil - ? if he is eating too much fiber try Levsin as needed  Orders: T-Tissue Transglutamase  Ab IgA ([id number])  Problem # 2:  GERD (ICD-530.81)  seems to be having more symptoms  ? related to above will get H. pylori IgM and treat if pos change to Nexium (d/c zantac)  The following medications were removed from the medication list:    Zantac 150 Mg Tabs (Ranitidine hcl) .Marland Kitchen... Take 1 tablet by mouth two times a day as needed for indigestion His updated medication list for this problem includes:    Levsin/sl 0.125 Mg Subl (Hyoscyamine sulfate) ..... One under tongue every 4 hours as needed for colon spasm or diarrhea    Nexium 40 Mg Cpdr (Esomeprazole magnesium) .Marland Kitchen... Take 1 tablet by mouth once a day  Orders: T-H Pylori Antibody IgM ([id number])  Complete Medication List: 1)  Miralax Powd (Polyethylene glycol 3350) .... Dissolve one capful in 8 oz glass of water and drink one glass a  day 2)  Anusol-hc 2.5 % Crea (Hydrocortisone) .... Apply once daily as needed 3)  Levsin/sl 0.125 Mg Subl (Hyoscyamine sulfate) .... One under tongue every 4 hours as needed for colon spasm or diarrhea 4)  Nexium 40 Mg Cpdr (Esomeprazole magnesium) .... Take 1 tablet by mouth once a day  Patient Instructions: 1)  Cut back to once daily on the metamucil.  Take two times a day if needed for constipation. 2)  Drink plenty of water.  Drink 6-8 glasses of water a day. 3)  Avoid spicy foods, caffeine, etc. 4)  Try the Levsin as needed for cramping and diarrhea.  You can take it beforehand if you know of a certain situation that will cause you diarrhea.  Side effects include dry mouth and drowsiness. 5)  Please schedule a follow-up appointment in 4-6 weeks with Scott for stomach.  Prescriptions: NEXIUM 40 MG CPDR (ESOMEPRAZOLE MAGNESIUM) Take 1 tablet by mouth once a day  #30 x 2   Entered and Authorized by:   Tereso Newcomer PA-C   Signed by:   Tereso Newcomer PA-C on 07/11/2010   Method used:   Print then Give to Patient   RxID:   [id number] LEVSIN/SL 0.125 MG SUBL (HYOSCYAMINE SULFATE) one under tongue every 4 hours as needed for colon spasm or diarrhea  #20 x 2   Entered and Authorized by:   Tereso Newcomer PA-C   Signed by:   Tereso Newcomer PA-C on 07/11/2010   Method used:   Print then Give to Patient   RxID:   [id number]   Laboratory Results   Urine Tests    Routine Urinalysis   Glucose: negative   (Normal Range: Negative) Bilirubin: negative   (Normal Range: Negative) Ketone: negative   (Normal Range: Negative) Spec. Gravity: >=1.030   (Normal Range: 1.003-1.035) Blood: negative   (Normal Range: Negative) pH: 5.0   (Normal Range: 5.0-8.0) Protein: negative   (Normal Range: Negative) Urobilinogen: 0.2   (Normal Range: 0-1) Nitrite: negative   (Normal Range: Negative) Leukocyte Esterace: negative   (Normal Range: Negative)        X-ray  Procedure date:   06/08/2010  Findings:      Exam Type: lumbar spine Results: Findings: Normal lumbar alignment.  Mild anterior wedging of L1   appears chronic.  There is disc degeneration with disc space   narrowing and anterior spurring at L1-2, L3-4, and L4-5.  There is   disc space narrowing at L5-S1.  Negative for pars defect.    IMPRESSION:   Lumbar disc degeneration.  No acute bony abnormality.

## 2010-12-24 ENCOUNTER — Other Ambulatory Visit (AMBULATORY_SURGERY_CENTER): Payer: Medicaid Other | Admitting: Gastroenterology

## 2010-12-24 ENCOUNTER — Other Ambulatory Visit: Payer: Self-pay | Admitting: Gastroenterology

## 2010-12-24 DIAGNOSIS — D126 Benign neoplasm of colon, unspecified: Secondary | ICD-10-CM

## 2010-12-24 DIAGNOSIS — K5900 Constipation, unspecified: Secondary | ICD-10-CM

## 2010-12-25 NOTE — Letter (Signed)
Summary: [facility] Instructions  [facility]  [address] [place], [place] [address]   Phone: [phone]  Fax: [phone]       Shane Reynolds    04-16-1956    MRN: [id number]        Procedure Day /Date:12/24/10 TUE     Arrival Time:730 am     Procedure Time:830 am     Location of Procedure:                    X  [facility] (4th Floor)                        PREPARATION FOR COLONOSCOPY WITH MOVIPREP   Starting 5 days prior to your procedure 12/19/10 do not eat nuts, seeds, popcorn, corn, beans, peas,  salads, or any raw vegetables.  Do not take any fiber supplements (e.g. Metamucil, Citrucel, and Benefiber).  THE DAY BEFORE YOUR PROCEDURE         DATE:12/23/10  DAY:MON  1.  Drink clear liquids the entire day-NO SOLID FOOD  2.  Do not drink anything colored red or purple.  Avoid juices with pulp.  No orange juice.  3.  Drink at least 64 oz. (8 glasses) of fluid/clear liquids during the day to prevent dehydration and help the prep work efficiently.  CLEAR LIQUIDS INCLUDE: Water Jello Ice Popsicles Tea (sugar ok, no milk/cream) Powdered fruit flavored drinks Coffee (sugar ok, no milk/cream) Gatorade Juice: apple, white grape, white cranberry  Lemonade Clear bullion, consomm, broth Carbonated beverages (any kind) Strained chicken noodle soup Hard Candy                             4.  In the morning, mix first dose of MoviPrep solution:    Empty 1 Pouch A and 1 Pouch B into the disposable container    Add lukewarm drinking water to the top line of the container. Mix to dissolve    Refrigerate (mixed solution should be used within 24 hrs)  5.  Begin drinking the prep at 5:00 p.m. The MoviPrep container is divided by 4 marks.   Every 15 minutes drink the solution down to the next mark (approximately 8 oz) until the full liter is complete.   6.  Follow completed prep with 16 oz of clear liquid of your choice (Nothing red or purple).   Continue to drink clear liquids until bedtime.  7.  Before going to bed, mix second dose of MoviPrep solution:    Empty 1 Pouch A and 1 Pouch B into the disposable container    Add lukewarm drinking water to the top line of the container. Mix to dissolve    Refrigerate  THE DAY OF YOUR PROCEDURE      DATE: 12/24/10 DAY: TUE  Beginning at 330 a.m. (5 hours before procedure):         1. Every 15 minutes, drink the solution down to the next mark (approx 8 oz) until the full liter is complete.  2. Follow completed prep with 16 oz. of clear liquid of your choice.    3. You may drink clear liquids until 630 am (2 HOURS BEFORE PROCEDURE).   MEDICATION INSTRUCTIONS  Unless otherwise instructed, you should take regular prescription medications with a small sip of water   as early as possible the morning of your procedure.  OTHER INSTRUCTIONS  You will need a responsible adult at least 57 years of age to accompany you and drive you home.   This person must remain in the waiting room during your procedure.  Wear loose fitting clothing that is easily removed.  Leave jewelry and other valuables at home.  However, you may wish to bring a book to read or  an iPod/MP3 player to listen to music as you wait for your procedure to start.  Remove all body piercing jewelry and leave at home.  Total time from sign-in until discharge is approximately 2-3 hours.  You should go home directly after your procedure and rest.  You can resume normal activities the  day after your procedure.  The day of your procedure you should not:   Drive   Make legal decisions   Operate machinery   Drink alcohol   Return to work  You will receive specific instructions about eating, activities and medications before you leave.    The above instructions have been reviewed and explained to me by   _______________________    I fully understand and can verbalize these instructions  _____________________________ Date _________

## 2010-12-25 NOTE — Progress Notes (Signed)
Summary: speak to nurse  Phone Note Call from Patient Call back at Home Phone [phone]   Caller: Patient Call For: Dr Christella Hartigan Reason for Call: Talk to Nurse Summary of Call: Patient wants to speak to nurse regarding activities he needs to do on the same day of his procedure. 2-7 Initial call taken by: Tawni Levy,  December 19, 2010 1:44 PM  Follow-up for Phone Call        tried to reach pt but no answer at above number and no message machine Chales Abrahams CMA Duncan Dull)  December 19, 2010 1:54 PM  Follow-up by: Chales Abrahams CMA Duncan Dull),  December 19, 2010 1:54 PM  Additional Follow-up for Phone Call Additional follow up Details #1::        th ept wanted to know if he could go to the gym after his procedure.  I advised that he would not be able to go to the gym that he needs to go home and rest for the rest of the day after the procedure.  pt agreed Additional Follow-up by: Chales Abrahams CMA Duncan Dull),  December 20, 2010 3:45 PM

## 2010-12-25 NOTE — Assessment & Plan Note (Addendum)
History of Present Illness Visit Type: Initial Consult Primary GI MD: Rob Bunting MD Primary Provider: Tereso Newcomer, PA-C Chief Complaint: Chronic IBS and GERD History of Present Illness:     pleasant 55 year old man who is here with his wife today. who has had IBS like symptoms for 25 years.  He has anxiety issues.    He tells me that zantac has helped in the past.  Milk has caused constipation in his 29s.  has trouble with constipation at times.  Trouble with spicey foods. Can have gas/bloating.  No real changes in weight.  Starting about 12 years ago, he had lack on energy.  And a cycle started.  Irregular bowels, alternating consipation/diarrhea.  Trouble with leg/hand cramps.  He had normal colonoscopy 6-7 years ago.   Zantac dosn't help.  Nexium doesn't help.  Can have pyrosis about once a week.  Pepto-bismol caused constipation, but eased his stomach.  Nexium might help with the pyrosis.   HAd no effect with his bowels.  No NSAIDs.             Current Medications (verified): 1)  Miralax  Powd (Polyethylene Glycol 3350) .... Dissolve One Capful in 8 Oz Glass of Water and Drink One Glass A Day 2)  Nexium 40 Mg Cpdr (Esomeprazole Magnesium) .... Take 1 Tablet By Mouth Once Daily 3)  Amitiza 8 Mcg Caps (Lubiprostone) .... One Capsule By Mouth Two Times A Day  (Never Started Taking)  Allergies (verified): 1)  ! Codeine  Past History:  Past Medical History: Irritable Bowel Syndrome h/o + Hep C antibody with negative w/u at [facility] 2005   a.  viral load negative at that time. . . no further w/u needed GERD Hyperlipidemia  a. previously on meds H/o genital herpes    Past Surgical History: Cholecystectomy (1998) Colonoscopy 2005 [facility])    Family History: Family History Diabetes 1st degree relative - mom Stroke - Dad (died in 57s) Skin Cancer - sister No colon cancer or prostate cancer. 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 . .wife may be pregnant Former Smoker Alcohol use-no Drug use-no    Review of Systems       Pertinent positive and negative review of systems were noted in the above HPI and GI specific review of systems.  All other review of systems was otherwise negative.   Vital Signs:  Patient profile:   55 year old male Height:      74 inches Weight:      188 pounds BMI:     24.23 Pulse rate:   68 / minute Pulse rhythm:   regular BP sitting:   138 / 86  (left arm)  Vitals Entered By: Milford Cage NCMA (December 17, 2010 2:07 PM)  Physical Exam  Additional Exam:  Constitutional: generally well appearing Psychiatric: alert and oriented times 3 Eyes: extraocular movements intact Mouth: oropharynx moist, no lesions Neck: supple, no lymphadenopathy Cardiovascular: heart regular rate and rythm Lungs: CTA bilaterally Abdomen: soft, non-tender, non-distended, no obvious ascites, no peritoneal signs, normal bowel sounds Extremities: no lower extremity edema bilaterally Skin: no lesions on visible extremities    Impression & Recommendations:  Problem # 1:  alternating constipation, loose stools, bloating,  I suspect he has irritable bowel syndrome. His last colonoscopy was about 7 years ago. We will get those results sent over for review. Is very troubled by his  bowel symptoms seems to be overly focused on. He admits anxiety is a serious issue for him. I think repeating his colonoscopy may  help his anxiety, also he has had bowel symptoms for many years we should checked sure we aren't missing something inflammatory.  he will get a basic set of labs today including CBC, complete metabolic profile, thyroid testing, stool testing.  Other Orders: TLB-BMP (Basic Metabolic Panel-BMET) (80048-METABOL) TLB-CBC Platelet - w/Differential (85025-CBCD) TLB-TSH (Thyroid Stimulating Hormone) (84443-TSH) T-Stool Giardia / Crypto- EIA  (16109) T-Stool for O&P (60454-09811) T-Fecal WBC (91478-29562)  Patient Instructions: 1)  Will get copy of colonoscopy report from [facility]. 2)  Decrease nexium to once daily. 3)  You will be scheduled to have a colonoscopy. 4)  You will get lab test(s) done today (stool for giardia, ova/parasites, fecal leukocytes). 5)  You will get lab test(s) done today (bmet, cbc, tsh). 6)  Metamucil can cause bloating, gassiness. 7)  The medication list was reviewed and reconciled.  All changed / newly prescribed medications were explained.  A complete medication list was provided to the patient / caregiver.  Appended Document: Orders Update/Moiv    Clinical Lists Changes  Medications: Added new medication of MOVIPREP 100 GM  SOLR (PEG-KCL-NACL-NASULF-NA ASC-C) As per prep instructions. - Signed Rx of MOVIPREP 100 GM  SOLR (PEG-KCL-NACL-NASULF-NA ASC-C) As per prep instructions.;  #1 x 0;  Signed;  Entered by: Chales Abrahams CMA (AAMA);  Authorized by: Rachael Fee MD;  Method used: Electronically to [facility]  [phone]*, [address] [place], [place], [place]  [address], Ph: [phone] or [phone], Fax: [phone] Orders: Added new Test order of Colonoscopy (Colon) - Signed    Prescriptions: MOVIPREP 100 GM  SOLR (PEG-KCL-NACL-NASULF-NA ASC-C) As per prep instructions.  #1 x 0   Entered by:   Chales Abrahams CMA (AAMA)   Authorized by:   Rachael Fee MD   Signed by:   Chales Abrahams CMA (AAMA) on 12/17/2010   Method used:   Electronically to        [facility]  [phone]* (retail)       [address]       [place], [place]  [address]       Ph: [phone] or [phone]       Fax: [phone]   RxID:   [id number]    Appended Document:  reviewed records: Colonoscopy  03/2003 at [facility] for hematochezia: complete exam, normal except for internal hemorrhoids

## 2010-12-31 ENCOUNTER — Telehealth (INDEPENDENT_AMBULATORY_CARE_PROVIDER_SITE_OTHER): Payer: Self-pay | Admitting: *Deleted

## 2010-12-31 ENCOUNTER — Encounter: Payer: Self-pay | Admitting: Gastroenterology

## 2011-01-08 NOTE — Progress Notes (Signed)
  Phone Note Other Incoming   Request: Send information Summary of Call: Records received from [facility]. 1 pages forwarded to Dr. Christella Hartigan for review.     Appended Document:  letter mailed

## 2011-01-08 NOTE — Procedures (Signed)
Summary: Colonoscopy   Colonoscopy  Procedure date:  12/24/2010  Findings:      Location:  [facility].   COLONOSCOPY PROCEDURE REPORT PATIENT:  Shane Reynolds, Shane Reynolds  MR#:  [id number] BIRTHDATE:   1956-01-15, 55 yrs. old   GENDER:   male ENDOSCOPIST:   Rachael Fee, MD REF. BY: Tereso Newcomer, P.A.-C PROCEDURE DATE:  12/24/2010 PROCEDURE:  Colonoscopy with snare polypectomy ASA CLASS:   Class II INDICATIONS: constipation, bloating, diarrhea at times MEDICATIONS:    Fentanyl 75 mcg IV, Versed 6 mg IV DESCRIPTION OF PROCEDURE:   After the risks benefits and alternatives of the procedure were thoroughly explained, informed consent was obtained.  Digital rectal exam was performed and revealed no rectal masses.   The LB [id number] endoscope was introduced through the anus and advanced to the terminal ileum which was intubated for a short distance, without limitations.  The quality of the prep was good, using MoviPrep.  The instrument was then slowly withdrawn as the colon was fully examined. <<PROCEDUREIMAGES>>        <<OLD IMAGES>> FINDINGS:  A diminutive polyp was found in the ascending colon. This was removed with cold snare and sent to pathology (jar 1) (see image5).  The terminal ileum appeared normal (see image3).  This was otherwise a normal examination of the colon (see image4, image1, and image6).   Retroflexed views in the rectum revealed no abnormalities.    The scope was then withdrawn from the patient and the procedure completed. COMPLICATIONS:   None  ENDOSCOPIC IMPRESSION:  1) Diminutive polyp in the ascending colon, removed and sent to pathology  2) Normal terminal ileum  3) Otherwise normal examination; symptoms are likely IBS related  RECOMMENDATIONS:  1) If the polyp removed today is proven to be adenomatous (pre-cancerous), you will need a repeat colonoscopy in 5 years. Otherwise you should continue to follow colorectal cancer screening  guidelines for "routine risk" patients with colonoscopy in 10 years.  2) You will receive a letter within 1-2 weeks with the results of your biopsy as well as final recommendations. Please call my office if you have not received a letter after 3 weeks.   _______________________________ Rachael Fee, MD     Appended Document: Colonoscopy     Procedures Next Due Date:    Colonoscopy: 12/2015

## 2011-01-08 NOTE — Letter (Signed)
Summary: Results Letter  [facility]  [address] [place], [place] [address]   Phone: [phone]  Fax: [phone]        December 31, 2010 MRN: [id number]    OSMANY AZER [address] [place], [place]  [address]    Dear Mr. Nuncio,   The polyp that I removed during your recent procedure was proven to be adenomatous.  These are pre-cancerous polyps that may have grown into cancers if they had not been removed.  Based on current nationally recognized surveillance guidelines, I recommend that you have a repeat colonoscopy in 5 years.  We will therefore put your information in our reminder system and will contact you in 5 years to schedule a repeat procedure.  Please call if you have any questions or concerns.       Sincerely,  Rachael Fee MD  This letter has been electronically signed by your physician.

## 2011-01-14 NOTE — Procedures (Signed)
Summary: Colonoscopy / [facility]  Colonoscopy / [facility]   Imported By: Lennie Odor 01/07/2011 14:31:17  _____________________________________________________________________  External Attachment:    Type:   Image     Comment:   External Document

## 2011-02-01 LAB — URINALYSIS, ROUTINE W REFLEX MICROSCOPIC
Bilirubin Urine: NEGATIVE
Glucose, UA: NEGATIVE mg/dL
Hgb urine dipstick: NEGATIVE
Ketones, ur: NEGATIVE mg/dL
Nitrite: NEGATIVE
Protein, ur: NEGATIVE mg/dL
Specific Gravity, Urine: 1.008 (ref 1.005–1.030)
Urobilinogen, UA: 0.2 mg/dL (ref 0.0–1.0)
pH: 5.5 (ref 5.0–8.0)

## 2011-02-01 LAB — MAGNESIUM: Magnesium: 2.3 mg/dL (ref 1.5–2.5)

## 2011-02-01 LAB — HEPATIC FUNCTION PANEL
ALT: 27 U/L (ref 0–53)
AST: 27 U/L (ref 0–37)
Albumin: 4.2 g/dL (ref 3.5–5.2)
Alkaline Phosphatase: 48 U/L (ref 39–117)
Bilirubin, Direct: 0.1 mg/dL (ref 0.0–0.3)
Indirect Bilirubin: 0.6 mg/dL (ref 0.3–0.9)
Total Bilirubin: 0.7 mg/dL (ref 0.3–1.2)
Total Protein: 7 g/dL (ref 6.0–8.3)

## 2011-02-01 LAB — POCT I-STAT, CHEM 8
BUN: 14 mg/dL (ref 6–23)
Calcium, Ion: 1.18 mmol/L (ref 1.12–1.32)
Chloride: 106 mEq/L (ref 96–112)
Creatinine, Ser: 1 mg/dL (ref 0.4–1.5)
Glucose, Bld: 138 mg/dL — ABNORMAL HIGH (ref 70–99)
HCT: 45 % (ref 39.0–52.0)
Hemoglobin: 15.3 g/dL (ref 13.0–17.0)
Potassium: 3.9 mEq/L (ref 3.5–5.1)
Sodium: 139 mEq/L (ref 135–145)
TCO2: 24 mmol/L (ref 0–100)

## 2011-02-01 LAB — POCT CARDIAC MARKERS
CKMB, poc: 2.2 ng/mL (ref 1.0–8.0)
CKMB, poc: 2.8 ng/mL (ref 1.0–8.0)
Myoglobin, poc: 68.3 ng/mL (ref 12–200)
Myoglobin, poc: 71.1 ng/mL (ref 12–200)
Troponin i, poc: <0.05 ng/mL (ref 0.00–0.09)
Troponin i, poc: <0.05 ng/mL (ref 0.00–0.09)

## 2011-02-01 LAB — DIFFERENTIAL
Basophils Absolute: 0 10*3/uL (ref 0.0–0.1)
Basophils Relative: 1 % (ref 0–1)
Eosinophils Absolute: 0 10*3/uL (ref 0.0–0.7)
Eosinophils Relative: 1 % (ref 0–5)
Lymphocytes Relative: 20 % (ref 12–46)
Lymphs Abs: 1.5 10*3/uL (ref 0.7–4.0)
Monocytes Absolute: 0.5 10*3/uL (ref 0.1–1.0)
Monocytes Relative: 6 % (ref 3–12)
Neutro Abs: 5.7 10*3/uL (ref 1.7–7.7)
Neutrophils Relative %: 73 % (ref 43–77)

## 2011-02-01 LAB — URINE CULTURE
Colony Count: NO GROWTH
Culture: NO GROWTH

## 2011-02-01 LAB — CBC
HCT: 40.3 % (ref 39.0–52.0)
Hemoglobin: 14.1 g/dL (ref 13.0–17.0)
MCH: 34.4 pg — ABNORMAL HIGH (ref 26.0–34.0)
MCHC: 35 g/dL (ref 30.0–36.0)
MCV: 98.3 fL (ref 78.0–100.0)
Platelets: 199 10*3/uL (ref 150–400)
RBC: 4.1 MIL/uL — ABNORMAL LOW (ref 4.22–5.81)
RDW: 12.1 % (ref 11.5–15.5)
WBC: 7.8 10*3/uL (ref 4.0–10.5)

## 2011-02-23 LAB — URINALYSIS, ROUTINE W REFLEX MICROSCOPIC
Bilirubin Urine: NEGATIVE
Ketones, ur: NEGATIVE mg/dL
Nitrite: NEGATIVE
Protein, ur: NEGATIVE mg/dL
pH: 6.5 (ref 5.0–8.0)

## 2011-02-23 LAB — COMPREHENSIVE METABOLIC PANEL
Albumin: 4.3 g/dL (ref 3.5–5.2)
BUN: 11 mg/dL (ref 6–23)
Calcium: 10 mg/dL (ref 8.4–10.5)
Glucose, Bld: 121 mg/dL — ABNORMAL HIGH (ref 70–99)
Potassium: 4.4 mEq/L (ref 3.5–5.1)
Total Protein: 7.4 g/dL (ref 6.0–8.3)

## 2011-02-23 LAB — CK TOTAL AND CKMB (NOT AT ARMC)
CK, MB: 3 ng/mL (ref 0.3–4.0)
CK, MB: 4.1 ng/mL — ABNORMAL HIGH (ref 0.3–4.0)
Relative Index: 3.4 — ABNORMAL HIGH (ref 0.0–2.5)
Relative Index: INVALID (ref 0.0–2.5)
Total CK: 122 U/L (ref 7–232)
Total CK: 89 U/L (ref 7–232)

## 2011-02-23 LAB — DIFFERENTIAL
Basophils Absolute: 0.1 10*3/uL (ref 0.0–0.1)
Basophils Relative: 1 % (ref 0–1)
Eosinophils Absolute: 0.1 10*3/uL (ref 0.0–0.7)
Eosinophils Relative: 1 % (ref 0–5)
Lymphocytes Relative: 21 % (ref 12–46)
Lymphocytes Relative: 39 % (ref 12–46)
Lymphs Abs: 2.2 10*3/uL (ref 0.7–4.0)
Lymphs Abs: 4 10*3/uL (ref 0.7–4.0)
Monocytes Absolute: 0.6 10*3/uL (ref 0.1–1.0)
Monocytes Absolute: 0.7 10*3/uL (ref 0.1–1.0)
Monocytes Relative: 6 % (ref 3–12)
Monocytes Relative: 7 % (ref 3–12)
Neutro Abs: 5.3 10*3/uL (ref 1.7–7.7)
Neutro Abs: 7.7 10*3/uL (ref 1.7–7.7)
Neutrophils Relative %: 52 % (ref 43–77)
Neutrophils Relative %: 73 % (ref 43–77)

## 2011-02-23 LAB — TROPONIN I
Troponin I: 0.01 ng/mL (ref 0.00–0.06)
Troponin I: 0.01 ng/mL (ref 0.00–0.06)

## 2011-02-23 LAB — POCT I-STAT, CHEM 8
Calcium, Ion: 1.22 mmol/L (ref 1.12–1.32)
Glucose, Bld: 91 mg/dL (ref 70–99)
HCT: 44 % (ref 39.0–52.0)
TCO2: 25 mmol/L (ref 0–100)

## 2011-02-23 LAB — CBC
HCT: 41.4 % (ref 39.0–52.0)
HCT: 44.1 % (ref 39.0–52.0)
Hemoglobin: 14 g/dL (ref 13.0–17.0)
Hemoglobin: 15.1 g/dL (ref 13.0–17.0)
MCHC: 33.9 g/dL (ref 30.0–36.0)
MCHC: 34.2 g/dL (ref 30.0–36.0)
MCV: 98.7 fL (ref 78.0–100.0)
Platelets: 205 10*3/uL (ref 150–400)
Platelets: 234 10*3/uL (ref 150–400)
RBC: 4.19 MIL/uL — ABNORMAL LOW (ref 4.22–5.81)
RDW: 12.4 % (ref 11.5–15.5)
RDW: 12.4 % (ref 11.5–15.5)
WBC: 10.2 10*3/uL (ref 4.0–10.5)

## 2011-04-16 ENCOUNTER — Emergency Department (HOSPITAL_COMMUNITY): Payer: Medicaid Other

## 2011-04-16 ENCOUNTER — Emergency Department (HOSPITAL_COMMUNITY)
Admission: EM | Admit: 2011-04-16 | Discharge: 2011-04-16 | Disposition: A | Discharge status: Home / Self Care | Payer: Medicaid Other | Attending: Emergency Medicine | Admitting: Emergency Medicine

## 2011-04-16 DIAGNOSIS — K589 Irritable bowel syndrome without diarrhea: Secondary | ICD-10-CM | POA: Insufficient documentation

## 2011-04-16 DIAGNOSIS — R5381 Other malaise: Secondary | ICD-10-CM | POA: Insufficient documentation

## 2011-04-16 DIAGNOSIS — T148 Other injury of unspecified body region: Secondary | ICD-10-CM | POA: Insufficient documentation

## 2011-04-16 DIAGNOSIS — W57XXXA Bitten or stung by nonvenomous insect and other nonvenomous arthropods, initial encounter: Secondary | ICD-10-CM | POA: Insufficient documentation

## 2011-04-16 DIAGNOSIS — R110 Nausea: Secondary | ICD-10-CM | POA: Insufficient documentation

## 2011-04-16 LAB — COMPREHENSIVE METABOLIC PANEL
CO2: 27 mEq/L (ref 19–32)
Calcium: 9.6 mg/dL (ref 8.4–10.5)
Creatinine, Ser: 0.86 mg/dL (ref 0.4–1.5)
GFR calc Af Amer: >60 mL/min (ref >60)
GFR calc non Af Amer: >60 mL/min (ref >60)
Glucose, Bld: 104 mg/dL — ABNORMAL HIGH (ref 70–99)

## 2011-04-16 LAB — DIFFERENTIAL
Basophils Absolute: 0 10*3/uL (ref 0.0–0.1)
Basophils Relative: 0 % (ref 0–1)
Eosinophils Absolute: 0.1 10*3/uL (ref 0.0–0.7)
Eosinophils Relative: 1 % (ref 0–5)
Lymphocytes Relative: 16 % (ref 12–46)
Monocytes Absolute: 0.6 10*3/uL (ref 0.1–1.0)

## 2011-04-16 LAB — GLUCOSE, CAPILLARY: Glucose-Capillary: 105 mg/dL — ABNORMAL HIGH (ref 70–99)

## 2011-04-16 LAB — URINALYSIS, ROUTINE W REFLEX MICROSCOPIC
Hgb urine dipstick: NEGATIVE
Nitrite: NEGATIVE
Protein, ur: NEGATIVE mg/dL
Urobilinogen, UA: 0.2 mg/dL (ref 0.0–1.0)

## 2011-04-16 LAB — CBC
HCT: 39.1 % (ref 39.0–52.0)
MCHC: 35.5 g/dL (ref 30.0–36.0)
Platelets: 200 10*3/uL (ref 150–400)
RDW: 12 % (ref 11.5–15.5)

## 2011-05-05 ENCOUNTER — Emergency Department (HOSPITAL_COMMUNITY)
Admission: EM | Admit: 2011-05-05 | Discharge: 2011-05-05 | Disposition: A | Discharge status: Home / Self Care | Payer: Medicaid Other | Attending: Emergency Medicine | Admitting: Emergency Medicine

## 2011-05-05 ENCOUNTER — Emergency Department (HOSPITAL_COMMUNITY): Payer: Medicaid Other

## 2011-05-05 DIAGNOSIS — R51 Headache: Secondary | ICD-10-CM | POA: Insufficient documentation

## 2011-05-05 DIAGNOSIS — K589 Irritable bowel syndrome without diarrhea: Secondary | ICD-10-CM | POA: Insufficient documentation

## 2011-05-05 DIAGNOSIS — E860 Dehydration: Secondary | ICD-10-CM | POA: Insufficient documentation

## 2011-05-05 DIAGNOSIS — Z79899 Other long term (current) drug therapy: Secondary | ICD-10-CM | POA: Insufficient documentation

## 2011-05-05 DIAGNOSIS — IMO0001 Reserved for inherently not codable concepts without codable children: Secondary | ICD-10-CM | POA: Insufficient documentation

## 2011-05-05 DIAGNOSIS — R112 Nausea with vomiting, unspecified: Secondary | ICD-10-CM | POA: Insufficient documentation

## 2011-05-05 DIAGNOSIS — R5381 Other malaise: Secondary | ICD-10-CM | POA: Insufficient documentation

## 2011-05-05 LAB — DIFFERENTIAL
Lymphocytes Relative: 15 % (ref 12–46)
Lymphs Abs: 1.4 10*3/uL (ref 0.7–4.0)
Neutrophils Relative %: 78 % — ABNORMAL HIGH (ref 43–77)

## 2011-05-05 LAB — URINALYSIS, ROUTINE W REFLEX MICROSCOPIC
Hgb urine dipstick: NEGATIVE
Protein, ur: NEGATIVE mg/dL
Urobilinogen, UA: 0.2 mg/dL (ref 0.0–1.0)

## 2011-05-05 LAB — COMPREHENSIVE METABOLIC PANEL
AST: 26 U/L (ref 0–37)
BUN: 15 mg/dL (ref 6–23)
CO2: 29 mEq/L (ref 19–32)
Chloride: 101 mEq/L (ref 96–112)
Creatinine, Ser: 0.95 mg/dL (ref 0.50–1.35)
GFR calc non Af Amer: >60 mL/min (ref >60)
Glucose, Bld: 109 mg/dL — ABNORMAL HIGH (ref 70–99)
Total Bilirubin: 0.8 mg/dL (ref 0.3–1.2)

## 2011-05-05 LAB — CBC
HCT: 41.5 % (ref 39.0–52.0)
Hemoglobin: 14.8 g/dL (ref 13.0–17.0)
MCV: 94.5 fL (ref 78.0–100.0)
RBC: 4.39 MIL/uL (ref 4.22–5.81)
WBC: 9.8 10*3/uL (ref 4.0–10.5)

## 2011-05-05 LAB — CK TOTAL AND CKMB (NOT AT ARMC): Total CK: 238 U/L — ABNORMAL HIGH (ref 7–232)

## 2011-06-18 ENCOUNTER — Ambulatory Visit: Payer: Medicaid Other | Admitting: Gastroenterology

## 2011-08-25 ENCOUNTER — Encounter: Payer: Self-pay | Admitting: Gastroenterology

## 2011-08-25 ENCOUNTER — Ambulatory Visit (INDEPENDENT_AMBULATORY_CARE_PROVIDER_SITE_OTHER): Payer: Medicaid Other | Admitting: Gastroenterology

## 2011-08-25 VITALS — BP 142/92 | HR 61 | Ht 74.0 in | Wt 174.2 lb

## 2011-08-25 DIAGNOSIS — K589 Irritable bowel syndrome without diarrhea: Secondary | ICD-10-CM

## 2011-08-25 DIAGNOSIS — Z860100 Personal history of colon polyps, unspecified: Secondary | ICD-10-CM | POA: Insufficient documentation

## 2011-08-25 NOTE — Patient Instructions (Signed)
Continue the metamucil daily. Hematology visit (tomorrow, already scheduled) for macrocytosis evaluation. No dedicated GI testing for your IBS like symptoms at this point, you had a colonoscopy earlier this year.

## 2011-08-25 NOTE — Progress Notes (Signed)
Review of pertinent gastrointestinal problems: 1. H/o TAs: Colonoscopy 03/2003 at [facility] for hematochezia: complete exam, normal except for internal hemorrhoids; colonscopy 12/2010 normal except small polpy, adenoma on pathology; recall at 5 year interval   HPI: This is a very pleasant 55 year old man whom I last saw about 6 months ago.  Since last visit 8 months ago.  Has been working out with his son for 3-4 months.  Started to feel tired, malaise.  Eventually diagnosed with [facility] Spotted Fever, was put on doxy for 7 days, then another course of 21 day doxy.  More recently bitten by more ticks and another course of doxy started last week.  Recent labs suggested macrocytosis but no anemia.  MCV 101.4 pounds.  He has lost 9 pounds in past several months.  He has been quite stressed out lately.    He does not like to take meds, even tylenol.  Currently takes 1/2 ativan that helps his nerves.  He has been having trouble with insomnia in past 2-3 weeks.    When he lays down he has a palpatating feeling, rumbling in his abdomen.  Started Remus Loffler recently for past few nights.  HE has always been a "hyper person" he tells me.  He is urinating a lot at night, this is new for him.  He has "wierd aches and pains" in bone in his right leg, knee.  Went to ortho and was told knee looked pretty normal.    No dysphagia, no nausea/vomiting even with antibiotics.  No fevers or chills.  He moves his bowels about every day.  "normal" in past few days.  Sometimes he has an "explosion" of loose stools.   He has has been on metamucil for 2-3 years.     Past Medical History:   IBS (irritable bowel syndrome)                               GERD (gastroesophageal reflux disease)                       Hyperlipidemia                                               Genital herpes                                               [place] spotted fever      2012                          Past Surgical History:  CHOLECYSTECTOMY                                              reports that he has quit smoking. He does not have any smokeless tobacco history on file. He reports that he does not drink alcohol or use illicit drugs.  family history includes Alcohol abuse in his mother; Diabetes in his mother; Skin cancer in his sister; and Stroke in his father.  There is no history of Colon cancer.    Current medicines and allergies were reviewed in [facility]    Physical Exam: BP 142/92  Pulse 61  Ht 6\' 2"  (1.88 m)  Wt 174 lb 3.2 oz (79.017 kg)  BMI 22.37 kg/m2  SpO2 98% Constitutional: generally well-appearing Psychiatric: alert and oriented x3 Abdomen: soft, nontender, nondistended, no obvious ascites, no peritoneal signs, normal bowel sounds     Assessment and plan: 55 y.o. male with multiple complaints but none really related to his GI tract.  I think it worse he has mild IBS from a gastroenterology standpoint. He is very anxious, recently was told he had [facility] spotted fever and that his red blood cells were too big. He has vague leg pains, headaches. I think much of this is related to stress. He is due to see a hematologist tomorrow for his macrocytosis which I think is a good idea. I would not recommend any further dedicated GI workup at this point unless new symptoms arise.

## 2011-08-26 ENCOUNTER — Other Ambulatory Visit: Payer: Self-pay | Admitting: Oncology

## 2011-08-26 ENCOUNTER — Encounter (HOSPITAL_BASED_OUTPATIENT_CLINIC_OR_DEPARTMENT_OTHER): Payer: Medicaid Other | Admitting: Oncology

## 2011-08-26 ENCOUNTER — Encounter: Payer: Medicaid Other | Admitting: Oncology

## 2011-08-26 DIAGNOSIS — D539 Nutritional anemia, unspecified: Secondary | ICD-10-CM

## 2011-08-26 LAB — CBC WITH DIFFERENTIAL/PLATELET
BASO%: 0.4 % (ref 0.0–2.0)
EOS%: 0.9 % (ref 0.0–7.0)
MCH: 34.7 pg — ABNORMAL HIGH (ref 27.2–33.4)
MCHC: 34.8 g/dL (ref 32.0–36.0)
RDW: 12.5 % (ref 11.0–14.6)
lymph#: 1.7 10*3/uL (ref 0.9–3.3)

## 2011-08-28 LAB — COMPREHENSIVE METABOLIC PANEL
ALT: 15 U/L (ref 0–53)
AST: 18 U/L (ref 0–37)
Albumin: 5.1 g/dL (ref 3.5–5.2)
Alkaline Phosphatase: 45 U/L (ref 39–117)
BUN: 17 mg/dL (ref 6–23)
Potassium: 5.4 mEq/L — ABNORMAL HIGH (ref 3.5–5.3)
Sodium: 139 mEq/L (ref 135–145)

## 2011-08-28 LAB — SPEP & IFE WITH QIG
Gamma Globulin: 12.2 % (ref 11.1–18.8)
IgG (Immunoglobin G), Serum: 1050 mg/dL (ref 650–1600)
IgM, Serum: 110 mg/dL (ref 41–251)
Total Protein, Serum Electrophoresis: 7.6 g/dL (ref 6.0–8.3)

## 2011-08-28 LAB — IRON AND TIBC
%SAT: 40 % (ref 20–55)
Iron: 163 ug/dL (ref 42–165)

## 2011-08-28 LAB — FERRITIN: Ferritin: 195 ng/mL (ref 22–322)

## 2011-10-20 ENCOUNTER — Telehealth: Payer: Self-pay | Admitting: *Deleted

## 2011-10-20 NOTE — Telephone Encounter (Signed)
called patient at home number patient's wife answered the phone patient's wife confirmed appointment for 11-20-2011 starting at 9:45am

## 2011-11-13 ENCOUNTER — Encounter: Payer: Self-pay | Admitting: *Deleted

## 2011-11-20 ENCOUNTER — Ambulatory Visit (HOSPITAL_BASED_OUTPATIENT_CLINIC_OR_DEPARTMENT_OTHER): Payer: Medicaid Other | Admitting: Oncology

## 2011-11-20 ENCOUNTER — Other Ambulatory Visit (HOSPITAL_BASED_OUTPATIENT_CLINIC_OR_DEPARTMENT_OTHER): Payer: Medicaid Other | Admitting: Lab

## 2011-11-20 ENCOUNTER — Other Ambulatory Visit: Payer: Self-pay | Admitting: Oncology

## 2011-11-20 VITALS — BP 152/88 | HR 53 | Temp 97.8°F | Ht 75.0 in | Wt 191.4 lb

## 2011-11-20 DIAGNOSIS — D7589 Other specified diseases of blood and blood-forming organs: Secondary | ICD-10-CM

## 2011-11-20 DIAGNOSIS — D539 Nutritional anemia, unspecified: Secondary | ICD-10-CM

## 2011-11-20 LAB — CBC WITH DIFFERENTIAL/PLATELET
BASO%: 1.1 % (ref 0.0–2.0)
Eosinophils Absolute: 0.3 10*3/uL (ref 0.0–0.5)
LYMPH%: 41.3 % (ref 14.0–49.0)
MCHC: 35 g/dL (ref 32.0–36.0)
MONO#: 0.5 10*3/uL (ref 0.1–0.9)
NEUT#: 2.8 10*3/uL (ref 1.5–6.5)
RBC: 4.31 10*6/uL (ref 4.20–5.82)
RDW: 12.5 % (ref 11.0–14.6)
WBC: 6.2 10*3/uL (ref 4.0–10.3)
lymph#: 2.6 10*3/uL (ref 0.9–3.3)

## 2011-11-20 LAB — CHCC SMEAR

## 2011-11-20 NOTE — Progress Notes (Signed)
Hematology and Oncology Follow Up Visit  Shane Reynolds [id number] 1956/07/24 56 y.o. 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   Current therapy: none needed  Interim History:  Shane Reynolds return for a follow up visit after being seen on 08/2011 for macrocytosis. He is feeling well without any  complaints. He reports no N/V no SOB or weakness. He had complete resolution of his symptoms. He is back to work without any hindrance or decline.  Medications: I have reviewed the patient's current medications. Current outpatient prescriptions:Ranitidine HCl (ZANTAC 75 PO), Take 75 mg by mouth as needed.  , Disp: , Rfl: ;  carisoprodol (SOMA) 350 MG tablet, Take 350 mg by mouth 4 (four) times daily as needed.  , Disp: , Rfl: ;  LORazepam (ATIVAN) 1 MG tablet, Take by mouth every 8 (eight) hours. , Disp: , Rfl: ;  venlafaxine (EFFEXOR-XR) 37.5 MG 24 hr capsule, Take 37.5 mg by mouth daily.  , Disp: , Rfl:  zolpidem (AMBIEN CR) 12.5 MG CR tablet, Take 12.5 mg by mouth at bedtime as needed.  , Disp: , Rfl:   Allergies:  Allergies  Allergen Reactions  . Codeine     Past Medical History, Surgical history, Social history, and Family History were reviewed and updated.  Review of Systems: Constitutional:  Negative for fever, chills, night sweats, anorexia, weight loss, pain. Cardiovascular: no chest pain or dyspnea on exertion Respiratory: no cough, shortness of breath, or wheezing Neurological: no TIA or stroke symptoms Dermatological: negative ENT: negative Skin: Negative. Gastrointestinal: no abdominal pain, change in bowel habits, or black or bloody stools Genito-Urinary: no dysuria, trouble voiding, or hematuria Hematological and Lymphatic: negative Breast: negative Musculoskeletal: negative Remaining ROS  negative. Physical Exam: Blood pressure 152/88, pulse 53, temperature 97.8 F (36.6 C), temperature source Oral, height 6\' 3"  (1.905 m), weight 191 lb 6.4 oz (86.818 kg). ECOG: 0 General appearance: alert Head: Normocephalic, without obvious abnormality, atraumatic Neck: no adenopathy, no carotid bruit, no JVD, supple, symmetrical, trachea midline and thyroid not enlarged, symmetric, no tenderness/mass/nodules Lymph nodes: Cervical, supraclavicular, and axillary nodes normal. Heart:regular rate and rhythm, S1, S2 normal, no murmur, click, rub or gallop Lung:chest clear, no wheezing, rales, normal symmetric air entry Abdomin: soft, non-tender, without masses or organomegaly EXT:no erythema, induration, or nodules   Lab Results: Lab Results  Component Value Date   WBC 6.2 11/20/2011   HGB 14.8 11/20/2011   HCT 42.2 11/20/2011   MCV 97.8 11/20/2011   PLT 196 11/20/2011     Chemistry      Component Value Date/Time   NA 139 08/26/2011 1027   NA 139 08/26/2011 1027   NA 139 08/26/2011 1027   NA 139 08/26/2011 1027   NA 139 08/26/2011 1027   K 5.4* 08/26/2011 1027   K 5.4* 08/26/2011 1027   K 5.4* 08/26/2011 1027   K 5.4* 08/26/2011 1027   K 5.4* 08/26/2011 1027   CL 102 08/26/2011 1027   CL 102 08/26/2011 1027   CL 102 08/26/2011 1027   CL 102 08/26/2011 1027   CL 102 08/26/2011 1027   CO2 26 08/26/2011 1027   CO2 26 08/26/2011 1027   CO2 26 08/26/2011 1027   CO2 26 08/26/2011 1027   CO2 26 08/26/2011 1027   BUN 17 08/26/2011 1027   BUN 17 08/26/2011 1027   BUN 17  08/26/2011 1027   BUN 17 08/26/2011 1027   BUN 17 08/26/2011 1027   CREATININE 1.04 08/26/2011 1027   CREATININE 1.04 08/26/2011 1027   CREATININE 1.04 08/26/2011 1027   CREATININE 1.04 08/26/2011 1027   CREATININE 1.04 08/26/2011 1027      Component Value Date/Time   CALCIUM 10.7* 08/26/2011 1027   CALCIUM 10.7* 08/26/2011 1027   CALCIUM 10.7* 08/26/2011 1027   CALCIUM 10.7* 08/26/2011 1027   CALCIUM 10.7* 08/26/2011 1027   ALKPHOS 45  08/26/2011 1027   ALKPHOS 45 08/26/2011 1027   ALKPHOS 45 08/26/2011 1027   ALKPHOS 45 08/26/2011 1027   ALKPHOS 45 08/26/2011 1027   AST 18 08/26/2011 1027   AST 18 08/26/2011 1027   AST 18 08/26/2011 1027   AST 18 08/26/2011 1027   AST 18 08/26/2011 1027   ALT 15 08/26/2011 1027   ALT 15 08/26/2011 1027   ALT 15 08/26/2011 1027   ALT 15 08/26/2011 1027   ALT 15 08/26/2011 1027   BILITOT 1.1 08/26/2011 1027   BILITOT 1.1 08/26/2011 1027   BILITOT 1.1 08/26/2011 1027   BILITOT 1.1 08/26/2011 1027   BILITOT 1.1 08/26/2011 1027        Impression and Plan: :   A 56 year old gentleman with the following issues:    1. Macrocytosis without anemia.  The differential diagnosis with Shane Reynolds was discussed in detail.  Macrocytosis without anemia could be seen predominantly with medication.  He had been on doxycycline in the past among other antibiotics, maybe other supplements that he is maybe not mentioning, certainly can cause mild macrocytosis.  Other conditions such as liver disease can certainly do that in the setting of hepatitis or cirrhosis. His macrocytosis has resolved at this time. No need for any further hematological work up at this time.  2. Tick bite/infecion: This has resolved at this time.  3. Follow up: PRN. I will be happy to see Shane Reynolds if needed in the future.    [facility], MD 1/3/201310:44 AM

## 2011-12-27 IMAGING — CR DG CHEST 2V
2 series · 2 of 2 positions shown · non-contrast
Comparison: 04/16/2011

CLINICAL DATA: Weakness

CHEST - 2 VIEW

[w chest pa]
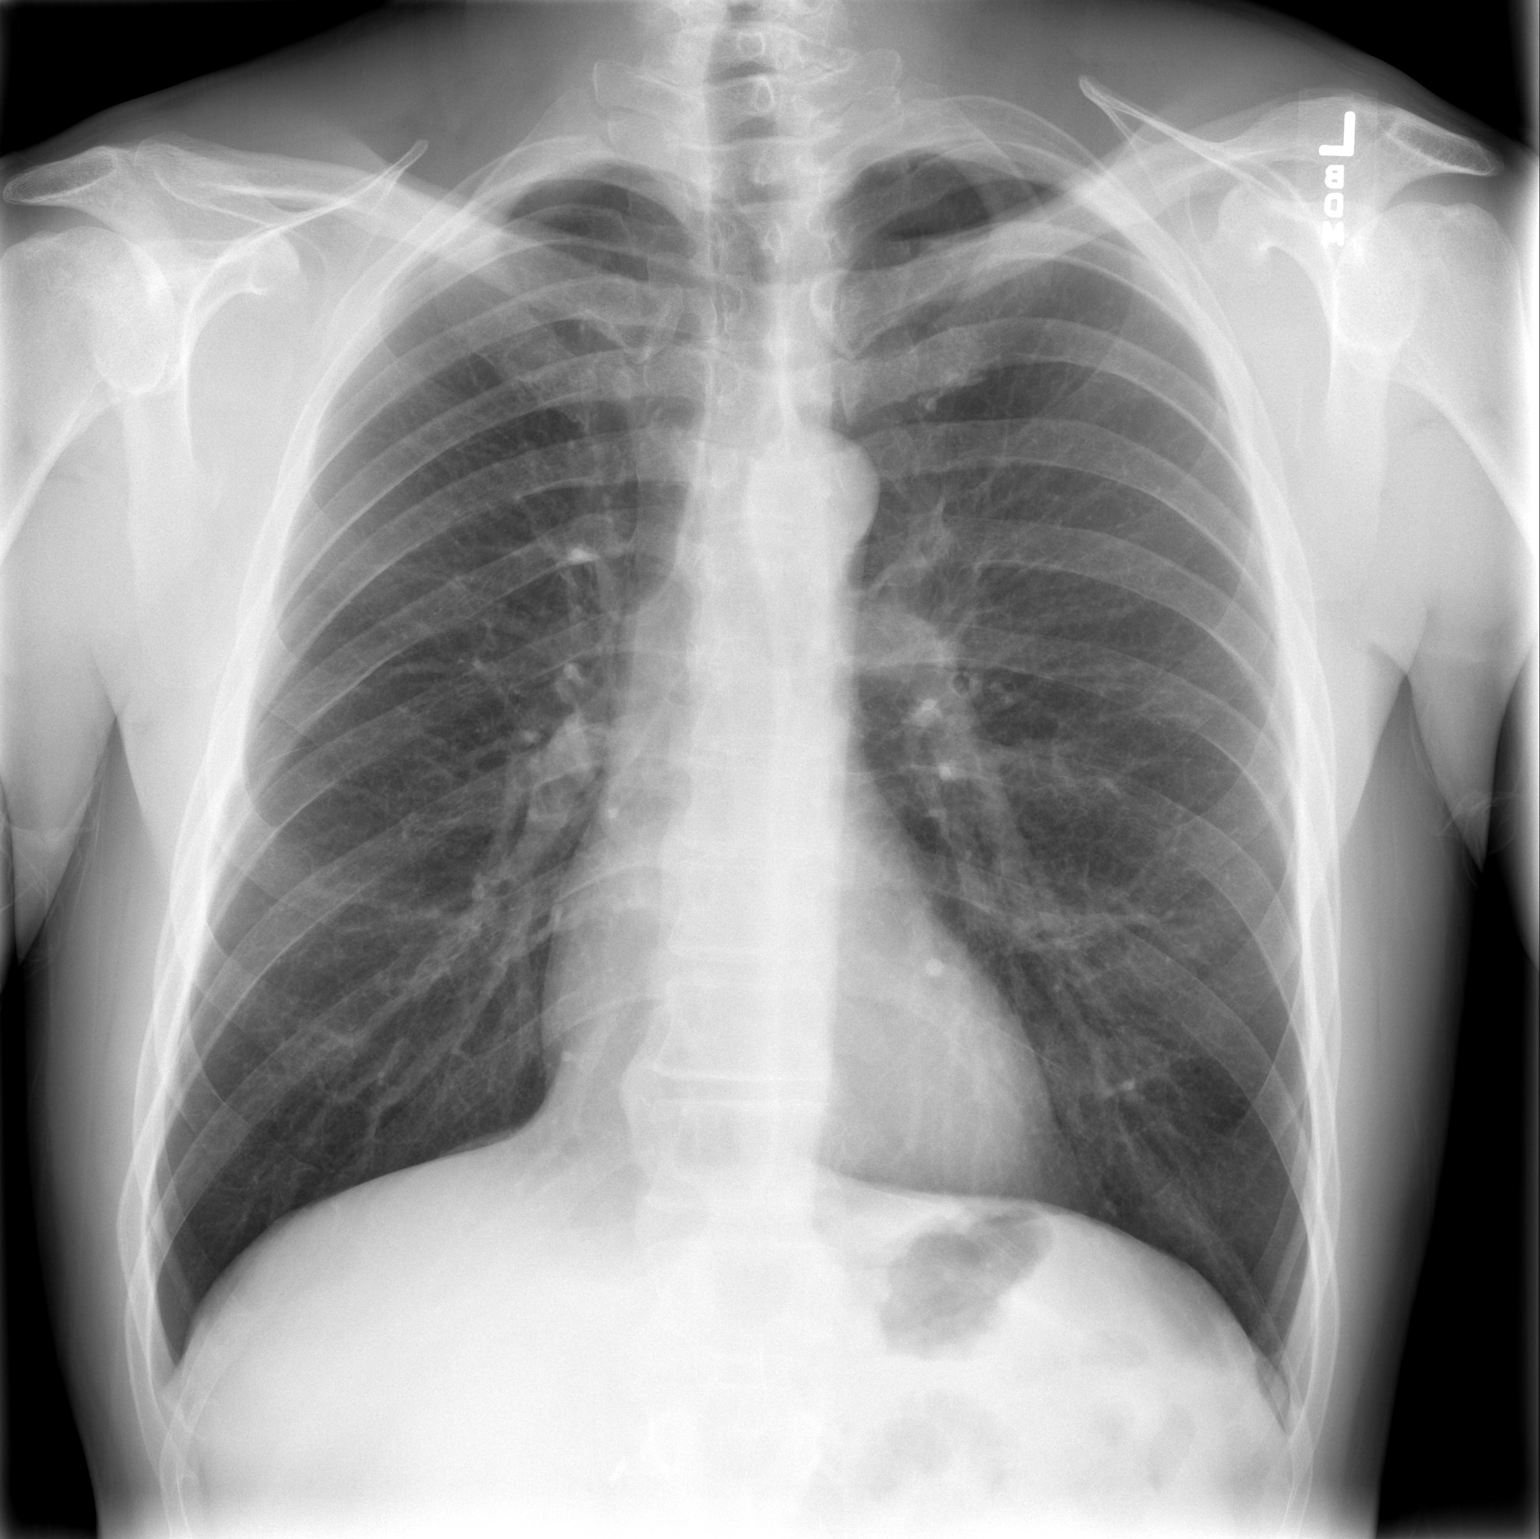

[w chest lat]
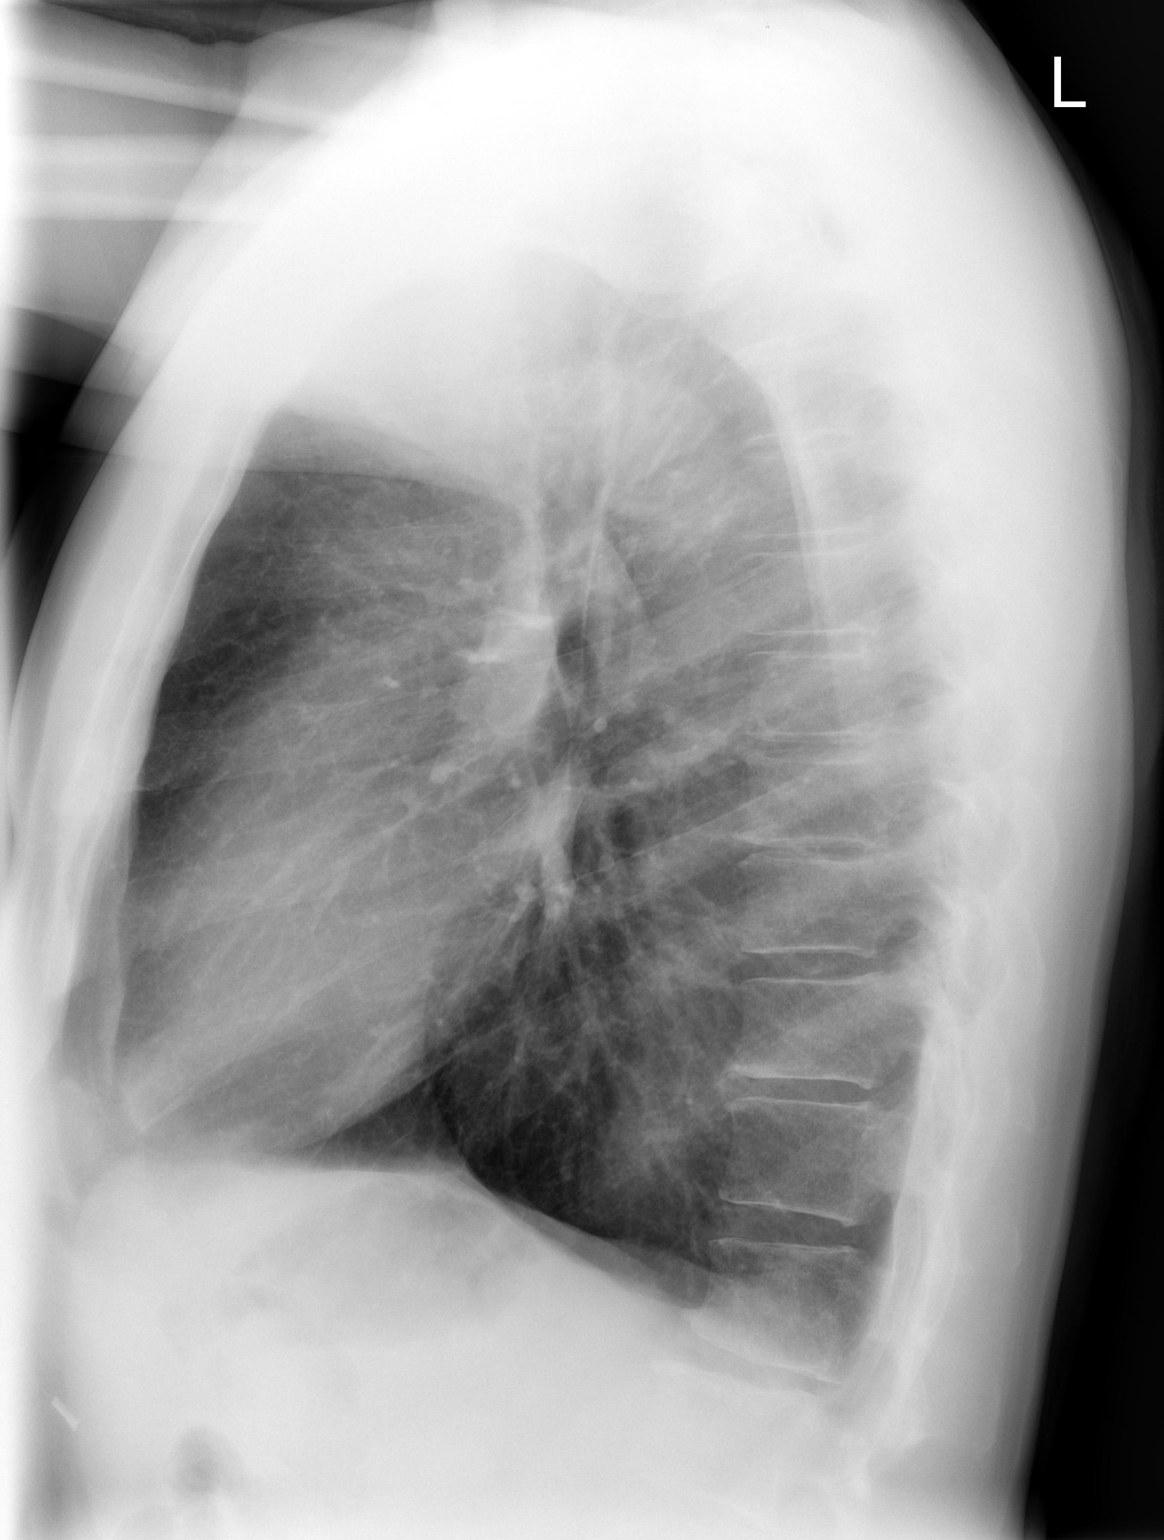

[2 of 2 positions shown; findings below may reference images not displayed]

FINDINGS: Heart and mediastinal contours normal.  Lungs
hyperaerated with central airway thickening.  No acute disease is
evident.  Osseous structures intact.
IMPRESSION: Chronic lung changes as above.  No acute findings.

## 2012-01-18 ENCOUNTER — Emergency Department (HOSPITAL_COMMUNITY)
Admission: EM | Admit: 2012-01-18 | Discharge: 2012-01-18 | Disposition: A | Discharge status: Home Health | Payer: Medicaid Other | Attending: Emergency Medicine | Admitting: Emergency Medicine

## 2012-01-18 ENCOUNTER — Emergency Department (HOSPITAL_COMMUNITY): Payer: Medicaid Other

## 2012-01-18 ENCOUNTER — Encounter (HOSPITAL_COMMUNITY): Payer: Self-pay | Admitting: *Deleted

## 2012-01-18 DIAGNOSIS — M25469 Effusion, unspecified knee: Secondary | ICD-10-CM | POA: Insufficient documentation

## 2012-01-18 DIAGNOSIS — Z79899 Other long term (current) drug therapy: Secondary | ICD-10-CM | POA: Insufficient documentation

## 2012-01-18 DIAGNOSIS — E785 Hyperlipidemia, unspecified: Secondary | ICD-10-CM | POA: Insufficient documentation

## 2012-01-18 DIAGNOSIS — M25569 Pain in unspecified knee: Secondary | ICD-10-CM | POA: Insufficient documentation

## 2012-01-18 DIAGNOSIS — M1710 Unilateral primary osteoarthritis, unspecified knee: Secondary | ICD-10-CM | POA: Insufficient documentation

## 2012-01-18 DIAGNOSIS — K589 Irritable bowel syndrome without diarrhea: Secondary | ICD-10-CM | POA: Insufficient documentation

## 2012-01-18 DIAGNOSIS — R209 Unspecified disturbances of skin sensation: Secondary | ICD-10-CM | POA: Insufficient documentation

## 2012-01-18 DIAGNOSIS — K219 Gastro-esophageal reflux disease without esophagitis: Secondary | ICD-10-CM | POA: Insufficient documentation

## 2012-01-18 DIAGNOSIS — M1990 Unspecified osteoarthritis, unspecified site: Secondary | ICD-10-CM

## 2012-01-18 MED ORDER — NAPROXEN 500 MG PO TABS: 500.0000 mg | ORAL_TABLET | Freq: Two times a day (BID) | ORAL | Status: AC

## 2012-01-18 NOTE — Progress Notes (Signed)
Orthopedic Tech Progress Note Patient Details:  Shane Reynolds 1956-10-17 [id number]  Other Ortho Devices Type of Ortho Device: Knee Sleeve Ortho Device Location: right knee Ortho Device Interventions: Application   Nikki Dom 01/18/2012, 3:56 PM

## 2012-01-18 NOTE — ED Notes (Addendum)
Pt c/o (R) knee swelling and pain x4 days, pt reports difficulty walking unable to work out as normal

## 2012-01-18 NOTE — Discharge Instructions (Signed)
Take medications as prescribed. Rest the knee. Use the brace for comfort.   Degenerative Arthritis You have osteoarthritis. This is the wear and tear arthritis that comes with aging. It is also called degenerative arthritis. This is common in people past middle age. It is caused by stress on the joints. The large weight bearing joints of the lower extremities are most often affected. The knees, hips, back, neck, and hands can become painful, swollen, and stiff. This is the most common type of arthritis. It comes on with age, carrying too much weight, or from an injury. Treatment includes resting the sore joint until the pain and swelling improve. Crutches or a walker may be needed for severe flares. Only take over-the-counter or prescription medicines for pain, discomfort, or fever as directed by your caregiver. Local heat therapy may improve motion. Cortisone shots into the joint are sometimes used to reduce pain and swelling during flares. Osteoarthritis is usually not crippling and progresses slowly. There are things you can do to decrease pain:  Avoid high impact activities.   Exercise regularly.   Low impact exercises such as walking, biking and swimming help to keep the muscles strong and keep normal joint function.   Stretching helps to keep your range of motion.   Lose weight if you are overweight. This reduces joint stress.  In severe cases when you have pain at rest or increasing disability, joint surgery may be helpful. See your caregiver for follow-up treatment as recommended.  SEEK IMMEDIATE MEDICAL CARE IF:   You have severe joint pain.   Marked swelling and redness in your joint develops.   You develop a high fever.  Document Released: 11/03/2005 Document Revised: 10/23/2011 Document Reviewed: 04/05/2007 [facility] Patient Information 2012 [place], [place].

## 2012-01-18 NOTE — ED Provider Notes (Signed)
History     CSN: [id number]  Arrival date & time 01/18/12  1441   First MD Initiated Contact with Patient 01/18/12 1529      Chief Complaint  Patient presents with  . Knee Pain    (Consider location/radiation/quality/duration/timing/severity/associated sxs/prior treatment) HPI Patient presents to the emergency room with complaints of knee pain. He has had trouble off and on for a couple years but has not had any recent issues until 4 days ago. Patient states he didn't do certain way then felt a sharp pain in the outside portion of his right knee. He also experienced a numbness sensation more medially. Patient states he noticed that his knee swelled up but that has gotten better over the last day or so..  The pain is currently a 5/10. It increases with movement and palpation.  He denies any fevers. He denies any redness. He denies any fall. Past Medical History  Diagnosis Date  . IBS (irritable bowel syndrome)   . GERD (gastroesophageal reflux disease)   . Hyperlipidemia   . Genital herpes   . [facility] spotted fever   . Anemia     Past Surgical History  Procedure Date  . Cholecystectomy     Family History  Problem Relation Age of Onset  . Diabetes Mother   . Stroke Father   . Skin cancer Sister   . Colon cancer Neg Hx   . Alcohol abuse Mother     History  Substance Use Topics  . Smoking status: Former Games developer  . Smokeless tobacco: Not on file  . Alcohol Use: No      Review of Systems  All other systems reviewed and are negative.    Allergies  Codeine  Home Medications   Current Outpatient Rx  Name Route Sig Dispense Refill  . CARISOPRODOL 350 MG PO TABS Oral Take 350 mg by mouth 4 (four) times daily as needed. For severe cramping    . IBUPROFEN 200 MG PO TABS Oral Take 400 mg by mouth every 6 (six) hours as needed. For pain    . ZANTAC 75 PO Oral Take 75 mg by mouth as needed. For irritable bowel symptoms      BP 185/85  Pulse 61  Temp(Src) 97.5  F (36.4 C) (Oral)  Resp 20  SpO2 99%  Physical Exam  Nursing note and vitals reviewed. Constitutional: He appears well-developed and well-nourished. No distress.  HENT:  Head: Normocephalic and atraumatic.  Right Ear: External ear normal.  Left Ear: External ear normal.  Eyes: Conjunctivae are normal. Right eye exhibits no discharge. Left eye exhibits no discharge. No scleral icterus.  Neck: Neck supple. No tracheal deviation present.  Cardiovascular: Normal rate.   Pulmonary/Chest: Effort normal. No stridor. No respiratory distress.  Musculoskeletal: He exhibits no edema.       Right knee: He exhibits no effusion, no laceration, no LCL laxity, normal patellar mobility and no MCL laxity. tenderness found. Lateral joint line and LCL tenderness noted.  Neurological: He is alert. Cranial nerve deficit: no gross deficits.  Skin: Skin is warm and dry. No rash noted.  Psychiatric: He has a normal mood and affect.    ED Course  Procedures (including critical care time)  Labs Reviewed - No data to display Dg Knee Complete 4 Views Right  01/18/2012  *RADIOLOGY REPORT*  Clinical Data: Right knee pain and swelling  RIGHT KNEE - COMPLETE 4+ VIEW  Comparison: None.  Findings: No fracture dislocation of the right knee.  There is chondrocalcinosis in the joint. There is spurring of the patella.  IMPRESSION:  1.  No acute findings. 2.  Mild osteoarthritis. 3.  Mild chondrocalcinosis.  Original Report Authenticated By: Genevive Bi, M.D.     MDM  X-rays show mild osteoarthritis. There is no sign of infection or fracture.  I will place the patient in a knee sleeve and refer him to an orthopedic Dr. for further evaluation.        Celene Kras, MD 01/18/12 [phone]

## 2012-01-18 NOTE — ED Notes (Signed)
Rt knee for 2-3 years.  Worse for the past 4 days.  No known injury.  He has had swelling for 4 days

## 2013-02-11 ENCOUNTER — Ambulatory Visit: Payer: Medicaid Other | Admitting: Gastroenterology

## 2013-02-11 ENCOUNTER — Telehealth: Payer: Self-pay | Admitting: Gastroenterology

## 2013-02-11 NOTE — Telephone Encounter (Signed)
Message copied by Arna Snipe on Fri Feb 11, 2013 10:25 AM ------      Message from: Donata Duff      Created: Fri Feb 11, 2013 10:08 AM       Do not bill ------

## 2013-09-07 ENCOUNTER — Encounter (HOSPITAL_COMMUNITY): Payer: Self-pay | Admitting: Emergency Medicine

## 2013-09-07 ENCOUNTER — Emergency Department (HOSPITAL_COMMUNITY): Payer: Medicaid Other

## 2013-09-07 ENCOUNTER — Emergency Department (HOSPITAL_COMMUNITY)
Admission: EM | Admit: 2013-09-07 | Discharge: 2013-09-07 | Disposition: A | Discharge status: Home / Self Care | Payer: Self-pay | Attending: Emergency Medicine | Admitting: Emergency Medicine

## 2013-09-07 DIAGNOSIS — Z8639 Personal history of other endocrine, nutritional and metabolic disease: Secondary | ICD-10-CM | POA: Insufficient documentation

## 2013-09-07 DIAGNOSIS — R630 Anorexia: Secondary | ICD-10-CM | POA: Insufficient documentation

## 2013-09-07 DIAGNOSIS — F419 Anxiety disorder, unspecified: Secondary | ICD-10-CM

## 2013-09-07 DIAGNOSIS — Z862 Personal history of diseases of the blood and blood-forming organs and certain disorders involving the immune mechanism: Secondary | ICD-10-CM | POA: Insufficient documentation

## 2013-09-07 DIAGNOSIS — F411 Generalized anxiety disorder: Secondary | ICD-10-CM | POA: Insufficient documentation

## 2013-09-07 DIAGNOSIS — M545 Low back pain, unspecified: Secondary | ICD-10-CM | POA: Insufficient documentation

## 2013-09-07 DIAGNOSIS — M62838 Other muscle spasm: Secondary | ICD-10-CM | POA: Insufficient documentation

## 2013-09-07 DIAGNOSIS — K219 Gastro-esophageal reflux disease without esophagitis: Secondary | ICD-10-CM | POA: Insufficient documentation

## 2013-09-07 DIAGNOSIS — F329 Major depressive disorder, single episode, unspecified: Secondary | ICD-10-CM | POA: Insufficient documentation

## 2013-09-07 DIAGNOSIS — R51 Headache: Secondary | ICD-10-CM | POA: Insufficient documentation

## 2013-09-07 DIAGNOSIS — Z79899 Other long term (current) drug therapy: Secondary | ICD-10-CM | POA: Insufficient documentation

## 2013-09-07 DIAGNOSIS — K589 Irritable bowel syndrome without diarrhea: Secondary | ICD-10-CM | POA: Insufficient documentation

## 2013-09-07 DIAGNOSIS — F3289 Other specified depressive episodes: Secondary | ICD-10-CM | POA: Insufficient documentation

## 2013-09-07 DIAGNOSIS — Z8619 Personal history of other infectious and parasitic diseases: Secondary | ICD-10-CM | POA: Insufficient documentation

## 2013-09-07 DIAGNOSIS — Z87891 Personal history of nicotine dependence: Secondary | ICD-10-CM | POA: Insufficient documentation

## 2013-09-07 HISTORY — DX: Gastro-esophageal reflux disease without esophagitis: K21.9

## 2013-09-07 HISTORY — DX: Depression, unspecified: F32.A

## 2013-09-07 HISTORY — DX: Major depressive disorder, single episode, unspecified: F32.9

## 2013-09-07 LAB — URINALYSIS, ROUTINE W REFLEX MICROSCOPIC
Bilirubin Urine: NEGATIVE
Ketones, ur: 15 mg/dL — AB
Protein, ur: NEGATIVE mg/dL
Urobilinogen, UA: 0.2 mg/dL (ref 0.0–1.0)

## 2013-09-07 LAB — COMPREHENSIVE METABOLIC PANEL
ALT: 19 U/L (ref 0–53)
Alkaline Phosphatase: 56 U/L (ref 39–117)
CO2: 24 mEq/L (ref 19–32)
Calcium: 10.1 mg/dL (ref 8.4–10.5)
GFR calc Af Amer: >90 mL/min (ref >90)
GFR calc non Af Amer: 79 mL/min — ABNORMAL LOW (ref >90)
Glucose, Bld: 105 mg/dL — ABNORMAL HIGH (ref 70–99)
Potassium: 4.3 mEq/L (ref 3.5–5.1)
Sodium: 136 mEq/L (ref 135–145)

## 2013-09-07 LAB — CBC WITH DIFFERENTIAL/PLATELET
Eosinophils Relative: 0 % (ref 0–5)
Hemoglobin: 14.9 g/dL (ref 13.0–17.0)
Lymphocytes Relative: 26 % (ref 12–46)
Lymphs Abs: 1.5 10*3/uL (ref 0.7–4.0)
MCV: 95 fL (ref 78.0–100.0)
Platelets: 208 10*3/uL (ref 150–400)
RBC: 4.56 MIL/uL (ref 4.22–5.81)
WBC: 6 10*3/uL (ref 4.0–10.5)

## 2013-09-07 MED ORDER — SODIUM CHLORIDE 0.9 % IV BOLUS (SEPSIS)
1000.0000 mL | Freq: Once | INTRAVENOUS | Status: AC
  Administered 2013-09-07: 1000 mL via INTRAVENOUS

## 2013-09-07 MED ORDER — IOHEXOL 300 MG/ML  SOLN
25.0000 mL | INTRAMUSCULAR | Status: AC
  Administered 2013-09-07 (×2): 25 mL via ORAL

## 2013-09-07 MED ORDER — LORAZEPAM 2 MG/ML IJ SOLN
0.5000 mg | Freq: Once | INTRAMUSCULAR | Status: AC
  Administered 2013-09-07: 0.5 mg via INTRAVENOUS
  Filled 2013-09-07: qty 1

## 2013-09-07 MED ORDER — KETOROLAC TROMETHAMINE 30 MG/ML IJ SOLN
30.0000 mg | Freq: Once | INTRAMUSCULAR | Status: AC
  Administered 2013-09-07: 30 mg via INTRAVENOUS
  Filled 2013-09-07: qty 1

## 2013-09-07 MED ORDER — IOHEXOL 300 MG/ML  SOLN
80.0000 mL | Freq: Once | INTRAMUSCULAR | Status: AC | PRN
  Administered 2013-09-07: 80 mL via INTRAVENOUS

## 2013-09-07 MED ORDER — BISACODYL 10 MG RE SUPP: 10.0000 mg | RECTAL | Status: DC | PRN

## 2013-09-07 MED ORDER — LORAZEPAM 1 MG PO TABS: 0.5000 mg | ORAL_TABLET | Freq: Three times a day (TID) | ORAL | Status: DC | PRN

## 2013-09-07 MED ORDER — FAMOTIDINE 20 MG PO TABS: 20.0000 mg | ORAL_TABLET | Freq: Two times a day (BID) | ORAL | Status: DC

## 2013-09-07 NOTE — ED Notes (Signed)
abd pain x 2 weeks not eating well some nausea burping a lot, burning pain legs are feeling bad and stabbing pain in rt side of back

## 2013-09-07 NOTE — ED Notes (Signed)
CT made aware that pt finished contrast.

## 2013-09-07 NOTE — ED Provider Notes (Signed)
CSN: [id number]     Arrival date & time 09/07/13  1033 History   First MD Initiated Contact with Patient 09/07/13 1116     Chief Complaint  Patient presents with  . Abdominal Pain  . Back Pain  . Headache   (Consider location/radiation/quality/duration/timing/severity/associated sxs/prior Treatment) HPI  Shane Reynolds is a 57 y.o.male with a significant PMH of IBS, GERD, anemia presents to the ER with complaints of abdominal pains for 2 weeks with decreased appetite.  The patient explains that he has chronic diarrhea and that these last couple of weeks he has been having constipation. The pain in his abdomen he describes as a nagging pain that is migratory He normally takes Metamucil 2 times a day which he has been continuing to do. He has also had increased burping, low back pain, loss of appetite as well as bilateral leg muscle spasms. He has not had any nausea or vomiting. Has not had any fevers or weakness. He is accompanied by his wife.    Past Medical History  Diagnosis Date  . IBS (irritable bowel syndrome)   . GERD (gastroesophageal reflux disease)   . Hyperlipidemia   . Genital herpes   . [facility] spotted fever   . Anemia   . Acid reflux   . Depression    Past Surgical History  Procedure Laterality Date  . Cholecystectomy     Family History  Problem Relation Age of Onset  . Diabetes Mother   . Stroke Father   . Skin cancer Sister   . Colon cancer Neg Hx   . Alcohol abuse Mother    History  Substance Use Topics  . Smoking status: Former Games developer  . Smokeless tobacco: Not on file  . Alcohol Use: No    Review of Systems  Review of Systems  Gen: no weight loss, fevers, chills, night sweats  Eyes: no discharge or drainage, no occular pain or visual changes  Nose: no epistaxis or rhinorrhea  Mouth: no dental pain, no sore throat  Neck: no neck pain  Lungs:No wheezing, coughing or hemoptysis CV: no chest pain, palpitations, dependent edema or  orthopnea  WUJ:WJXBJY, vomiting  GU: no dysuria or gross hematuria  MSK:  No abnormalities  Neuro: no headache, no focal neurologic deficits  Skin: no abnormalities Psyche: negative.   Allergies  Codeine  Home Medications   Current Outpatient Rx  Name  Route  Sig  Dispense  Refill  . acetaminophen (TYLENOL) 500 MG tablet   Oral   Take 500 mg by mouth once.         . calcium carbonate (TUMS - DOSED IN MG ELEMENTAL CALCIUM) 500 MG chewable tablet   Oral   Chew 1 tablet by mouth daily.         Marland Kitchen LORazepam (ATIVAN) 0.5 MG tablet   Oral   Take 0.5 mg by mouth every 8 (eight) hours.         . pantoprazole (PROTONIX) 40 MG tablet   Oral   Take 40 mg by mouth daily.         . Simethicone (GAS-X PO)   Oral   Take 1 tablet by mouth daily as needed (for gas).         . bisacodyl (DULCOLAX) 10 MG suppository   Rectal   Place 1 suppository (10 mg total) rectally as needed for constipation.   2 suppository   0   . famotidine (PEPCID) 20 MG tablet   Oral  Take 1 tablet (20 mg total) by mouth 2 (two) times daily.   30 tablet   0   . LORazepam (ATIVAN) 1 MG tablet   Oral   Take 0.5 tablets (0.5 mg total) by mouth 3 (three) times daily as needed for anxiety.   30 tablet   0    BP 155/89  Pulse 57  Temp(Src) 97.6 F (36.4 C) (Oral)  Resp 16  SpO2 100% Physical Exam  Nursing note and vitals reviewed. Constitutional: He appears well-developed and well-nourished. No distress.  HENT:  Head: Normocephalic and atraumatic.  Eyes: Pupils are equal, round, and reactive to light.  Neck: Normal range of motion. Neck supple.  Cardiovascular: Normal rate and regular rhythm.   Pulmonary/Chest: Effort normal.  Abdominal: Soft. He exhibits no distension, no fluid wave and no ascites. Bowel sounds are increased. There is tenderness (mild diffuse discomfort to abdomen). There is no rebound and no guarding.  Neurological: He is alert.  Skin: Skin is warm and dry.    ED  Course  Procedures (including critical care time) Labs Review Labs Reviewed  COMPREHENSIVE METABOLIC PANEL - Abnormal; Notable for the following:    Glucose, Bld 105 (*)    GFR calc non Af Amer 79 (*)    All other components within normal limits  URINALYSIS, ROUTINE W REFLEX MICROSCOPIC - Abnormal; Notable for the following:    Hgb urine dipstick TRACE (*)    Ketones, ur 15 (*)    All other components within normal limits  CBC WITH DIFFERENTIAL  LIPASE, BLOOD  URINE MICROSCOPIC-ADD ON   Imaging Review Ct Abdomen Pelvis W Contrast  09/07/2013   CLINICAL DATA:  History of irritable bowel syndrome. Left flank pain and lower back pain. Intermittent nausea.  EXAM: CT ABDOMEN AND PELVIS WITH CONTRAST  TECHNIQUE: Multidetector CT imaging of the abdomen and pelvis was performed using the standard protocol following bolus administration of intravenous contrast.  CONTRAST:  80mL OMNIPAQUE IOHEXOL 300 MG/ML  SOLN  COMPARISON:  CT of the abdomen and pelvis 09/30/2003.  FINDINGS: Lung Bases: Unremarkable.  Abdomen/Pelvis: Status post cholecystectomy. The appearance of the liver, pancreas, spleen, bilateral adrenal glands and bilateral kidneys is unremarkable. Normal appendix. No significant volume of ascites. No pneumoperitoneum. No pathologic distention of small bowel. No definite lymphadenopathy identified within the abdomen or pelvis. Prostate gland and urinary bladder are unremarkable in appearance.  Musculoskeletal: There are no aggressive appearing lytic or blastic lesions noted in the visualized portions of the skeleton.  IMPRESSION: 1. No acute findings in the abdomen or pelvis to account for the patient's symptoms. 2. Normal appendix. 3. Status post cholecystectomy.   Electronically Signed   By: Trudie Reed M.D.   On: 09/07/2013 16:08    EKG Interpretation   None       MDM   1. IBS (irritable bowel syndrome)   2. Anxiety    Pt given 0.5mg  Ativan in his IV which significantly helped  his symptoms. Labs and CT scan of the abdomen are unremarkable. I believe that the patients worsening of IBS is related to his stress.  He is a very anxious guy. Unable to get a refill of his Ativan ebcause he lost his insurance and cant afford to see his PCP. Will refill his medications.  57 y.o.Shane Reynolds's evaluation in the Emergency Department is complete. It has been determined that no acute conditions requiring further emergency intervention are present at this time. The patient/guardian have been advised of the  diagnosis and plan. We have discussed signs and symptoms that warrant return to the ED, such as changes or worsening in symptoms.  Vital signs are stable at discharge. Filed Vitals:   09/07/13 1615  BP: 155/89  Pulse: 57  Temp:   Resp:     Patient/guardian has voiced understanding and agreed to follow-up with the PCP or specialist.     Dorthula Matas, PA-C 09/07/13 1638

## 2013-09-09 NOTE — ED Provider Notes (Signed)
  Medical screening examination/treatment/procedure(s) were performed by non-physician practitioner and as supervising physician I was immediately available for consultation/collaboration.  EKG Interpretation   None          Gerhard Munch, MD 09/09/13 1021

## 2015-05-30 ENCOUNTER — Telehealth: Payer: Self-pay | Admitting: Gastroenterology

## 2015-05-30 NOTE — Telephone Encounter (Signed)
No voice mail pt will need an appt he has not been seen since 2012

## 2015-05-31 NOTE — Telephone Encounter (Signed)
Pt aware that he needs to follow up with Dr Ardis Hughs but he wants to call back to set that up.

## 2015-10-08 ENCOUNTER — Emergency Department (HOSPITAL_COMMUNITY)
Admission: EM | Admit: 2015-10-08 | Discharge: 2015-10-08 | Disposition: A | Discharge status: Home / Self Care | Payer: Medicaid Other | Attending: Emergency Medicine | Admitting: Emergency Medicine

## 2015-10-08 ENCOUNTER — Emergency Department (EMERGENCY_DEPARTMENT_HOSPITAL): Payer: Medicaid Other

## 2015-10-08 ENCOUNTER — Encounter (HOSPITAL_COMMUNITY): Payer: Self-pay | Admitting: *Deleted

## 2015-10-08 DIAGNOSIS — F329 Major depressive disorder, single episode, unspecified: Secondary | ICD-10-CM | POA: Insufficient documentation

## 2015-10-08 DIAGNOSIS — Z79899 Other long term (current) drug therapy: Secondary | ICD-10-CM | POA: Insufficient documentation

## 2015-10-08 DIAGNOSIS — M79609 Pain in unspecified limb: Secondary | ICD-10-CM

## 2015-10-08 DIAGNOSIS — Z862 Personal history of diseases of the blood and blood-forming organs and certain disorders involving the immune mechanism: Secondary | ICD-10-CM | POA: Insufficient documentation

## 2015-10-08 DIAGNOSIS — K219 Gastro-esophageal reflux disease without esophagitis: Secondary | ICD-10-CM | POA: Insufficient documentation

## 2015-10-08 DIAGNOSIS — Z8619 Personal history of other infectious and parasitic diseases: Secondary | ICD-10-CM | POA: Insufficient documentation

## 2015-10-08 DIAGNOSIS — Z87891 Personal history of nicotine dependence: Secondary | ICD-10-CM | POA: Insufficient documentation

## 2015-10-08 DIAGNOSIS — M79605 Pain in left leg: Secondary | ICD-10-CM | POA: Insufficient documentation

## 2015-10-08 DIAGNOSIS — Z8639 Personal history of other endocrine, nutritional and metabolic disease: Secondary | ICD-10-CM | POA: Insufficient documentation

## 2015-10-08 MED ORDER — NAPROXEN 250 MG PO TABS: 250.0000 mg | ORAL_TABLET | Freq: Two times a day (BID) | ORAL | Status: DC

## 2015-10-08 MED ORDER — METHOCARBAMOL 500 MG PO TABS: 500.0000 mg | ORAL_TABLET | Freq: Two times a day (BID) | ORAL | Status: DC | PRN

## 2015-10-08 MED ORDER — OMEPRAZOLE 20 MG PO CPDR: 20.0000 mg | DELAYED_RELEASE_CAPSULE | Freq: Every day | ORAL | Status: DC

## 2015-10-08 MED ORDER — KETOROLAC TROMETHAMINE 30 MG/ML IJ SOLN
30.0000 mg | Freq: Once | INTRAMUSCULAR | Status: AC
  Administered 2015-10-08: 30 mg via INTRAMUSCULAR
  Filled 2015-10-08: qty 1

## 2015-10-08 NOTE — ED Notes (Signed)
Pt in vascular.

## 2015-10-08 NOTE — ED Notes (Signed)
Pt reports onset of pain to back of left knee 3 weeks ago. Denies any specific injury to leg. Has pain to back of knee and it radiates down into calf.

## 2015-10-08 NOTE — ED Provider Notes (Signed)
CSN: [id number]     Arrival date & time 10/08/15  1316 History  By signing my name below, I, [name], attest that this documentation has been prepared under the direction and in the presence of Will Peyten Weare, PA-C.  Electronically Signed: [name], ED Scribe. 10/08/2015. 2:57 PM.     Chief Complaint  Patient presents with  . Leg Pain      Patient is a 59 y.o. male presenting with leg pain. The history is provided by the patient. No language interpreter was used.  Leg Pain Associated symptoms: no back pain, no fever and no neck pain     HPI Comments: Shane Reynolds is a 59 y.o. male who presents to the Emergency Department complaining of intermittent 7/10 worsening pain behind his left knee that radiates down to his calf for 3 weeks.  He reports intermittent tingling in all of his toes that has been present for around two and a half weeks. Pt reports the pain worsens with bending of the knee, and states it radiates down the back of his left leg towards his foot. Pt denies injury to the area, denies leg swelling. Pt denies taking any medication to treat his pain. Pt denies chest pain or SOB. He denies history of DVT or PE. He denies injury to his back or leg. He denies new or worsening back pain. He denies numbness, weakness, fevers or rashes.   Past Medical History  Diagnosis Date  . IBS (irritable bowel syndrome)   . GERD (gastroesophageal reflux disease)   . Hyperlipidemia   . Genital herpes   . [facility] spotted fever   . Anemia   . Acid reflux   . Depression    Past Surgical History  Procedure Laterality Date  . Cholecystectomy     Family History  Problem Relation Age of Onset  . Diabetes Mother   . Stroke Father   . Skin cancer Sister   . Colon cancer Neg Hx   . Alcohol abuse Mother    Social History  Substance Use Topics  . Smoking status: Former Research scientist (life sciences)  . Smokeless tobacco: None  . Alcohol Use: No    Review of Systems  Constitutional: Negative  for fever and chills.  Respiratory: Negative for shortness of breath.   Cardiovascular: Negative for chest pain and leg swelling.  Gastrointestinal: Negative for nausea, vomiting and abdominal pain.  Genitourinary: Negative for dysuria and difficulty urinating.  Musculoskeletal: Positive for myalgias and arthralgias. Negative for back pain, gait problem, neck pain and neck stiffness.  Skin: Negative for color change, rash and wound.  Neurological: Negative for weakness and numbness.      Allergies  Codeine  Home Medications   Prior to Admission medications   Medication Sig Start Date End Date Taking? Authorizing Provider  acetaminophen (TYLENOL) 500 MG tablet Take 500 mg by mouth once.   Yes Historical Provider, MD  calcium carbonate (TUMS - DOSED IN MG ELEMENTAL CALCIUM) 500 MG chewable tablet Chew 1 tablet by mouth daily.   Yes Historical Provider, MD  LORazepam (ATIVAN) 0.5 MG tablet Take 0.5 mg by mouth every 8 (eight) hours as needed for anxiety.    Yes Historical Provider, MD  LORazepam (ATIVAN) 1 MG tablet Take 0.5 tablets (0.5 mg total) by mouth 3 (three) times daily as needed for anxiety. Patient not taking: Reported on 10/08/2015 09/07/13   Delos Haring, PA-C  methocarbamol (ROBAXIN) 500 MG tablet Take 1 tablet (500 mg total) by mouth 2 (  two) times daily as needed for muscle spasms. 10/08/15   Waynetta Pean, PA-C  naproxen (NAPROSYN) 250 MG tablet Take 1 tablet (250 mg total) by mouth 2 (two) times daily with a meal. 10/08/15   Waynetta Pean, PA-C  omeprazole (PRILOSEC) 20 MG capsule Take 1 capsule (20 mg total) by mouth daily. 10/08/15   Waynetta Pean, PA-C   BP 153/85 mmHg  Pulse 60  Temp(Src) 98.1 F (36.7 C) (Oral)  Resp 18  SpO2 98% Physical Exam  Constitutional: He appears well-developed and well-nourished. No distress.  Nontoxic-appearing.  HENT:  Head: Normocephalic and atraumatic.  Right Ear: External ear normal.  Left Ear: External ear normal.  Eyes:  Right eye exhibits no discharge. Left eye exhibits no discharge.  Cardiovascular: Normal rate, regular rhythm and intact distal pulses.   Bilateral dorsalis pedis and posterior tibialis pulses are intact. Good capillary refill to his left distal toes.  Pulmonary/Chest: Effort normal. No respiratory distress.  Musculoskeletal: Normal range of motion. He exhibits tenderness. He exhibits no edema.  No lower extremity edema, or deformity. No left knee deformity. No calf edema. Patient has mild calf tenderness to palpation. No ecchymosis or warmth noted to his lower extremities. Patient is able normal gait. He has good strength in his bilateral lower extremities.  Neurological: He is alert. Coordination normal.  The patient's sensation is intact in his bilateral lower extremities. He is able to ambulate with normal gait.  Skin: Skin is warm and dry. No rash noted. He is not diaphoretic. No erythema. No pallor.  Psychiatric: He has a normal mood and affect. His behavior is normal.  Nursing note and vitals reviewed.   ED Course  Procedures (including critical care time)  DIAGNOSTIC STUDIES: Oxygen Saturation is 98% on room air, normal by my interpretation.    COORDINATION OF CARE: 2:57 PM Discussed treatment plan with pt at bedside and pt agreed to plan.   Labs Review Labs Reviewed - No data to display  Imaging Review No results found. Left LE DVT study was negative for DVT.    EKG Interpretation None      Filed Vitals:   10/08/15 1339  BP: 153/85  Pulse: 60  Temp: 98.1 F (36.7 C)  TempSrc: Oral  Resp: 18  SpO2: 98%     MDM   Meds given in ED:  Medications  ketorolac (TORADOL) 30 MG/ML injection 30 mg (not administered)    New Prescriptions   METHOCARBAMOL (ROBAXIN) 500 MG TABLET    Take 1 tablet (500 mg total) by mouth 2 (two) times daily as needed for muscle spasms.   NAPROXEN (NAPROSYN) 250 MG TABLET    Take 1 tablet (250 mg total) by mouth 2 (two) times daily with  a meal.   OMEPRAZOLE (PRILOSEC) 20 MG CAPSULE    Take 1 capsule (20 mg total) by mouth daily.    Final diagnoses:  Left leg pain   Is a 59 year old male who presents to the emergency department complaining of pain to the back of his knee down to his back of his calf for the past 3 weeks. He denies injury or trauma to his leg. He denies new or worsening back pain. He denies any swelling to his lower extremities. On exam the patient is afebrile nontoxic-appearing. He has no lower extremity edema or deformity noted. Patient does have tenderness to his left calf. No bony point tenderness. Will obtain DVT study. Patient slept DVT study was negative for DVT. Patient's most recent creatinine  shows a creatinine of 1.02. Will discharge with prescriptions for a short course of naproxen, and Robaxin. I encouraged close follow-up by primary care. Patient also requests a refill on omeprazole which he has not been taking recently. I gave him a small amount of omeprazole as well. I advised the patient to follow-up with their primary care provider this week. I advised the patient to return to the emergency department with new or worsening symptoms or new concerns. The patient verbalized understanding and agreement with plan.   I personally performed the services described in this documentation, which was scribed in my presence. The recorded information has been reviewed and is accurate.       Waynetta Pean, PA-C 10/08/15 1731  Charlesetta Shanks, MD 10/15/15 1426

## 2015-10-08 NOTE — Progress Notes (Signed)
Preliminary results by tech - Left Lower Ext. Venous Duplex Completed. Negative for deep and superficial vein thrombosis in the left leg. Bettye Sitton, BS, RDMS, RVT  

## 2015-10-08 NOTE — Discharge Instructions (Signed)
Musculoskeletal Pain Musculoskeletal pain is muscle and boney aches and pains. These pains can occur in any part of the body. Your caregiver may treat you without knowing the cause of the pain. They may treat you if blood or urine tests, X-rays, and other tests were normal.  CAUSES There is often not a definite cause or reason for these pains. These pains may be caused by a type of germ (virus). The discomfort may also come from overuse. Overuse includes working out too hard when your body is not fit. Boney aches also come from weather changes. Bone is sensitive to atmospheric pressure changes. HOME CARE INSTRUCTIONS   Ask when your test results will be ready. Make sure you get your test results.  Only take over-the-counter or prescription medicines for pain, discomfort, or fever as directed by your caregiver. If you were given medications for your condition, do not drive, operate machinery or power tools, or sign legal documents for 24 hours. Do not drink alcohol. Do not take sleeping pills or other medications that may interfere with treatment.  Continue all activities unless the activities cause more pain. When the pain lessens, slowly resume normal activities. Gradually increase the intensity and duration of the activities or exercise.  During periods of severe pain, bed rest may be helpful. Lay or sit in any position that is comfortable.  Putting ice on the injured area.  Put ice in a bag.  Place a towel between your skin and the bag.  Leave the ice on for 15 to 20 minutes, 3 to 4 times a day.  Follow up with your caregiver for continued problems and no reason can be found for the pain. If the pain becomes worse or does not go away, it may be necessary to repeat tests or do additional testing. Your caregiver may need to look further for a possible cause. SEEK IMMEDIATE MEDICAL CARE IF:  You have pain that is getting worse and is not relieved by medications.  You develop chest pain  that is associated with shortness or breath, sweating, feeling sick to your stomach (nauseous), or throw up (vomit).  Your pain becomes localized to the abdomen.  You develop any new symptoms that seem different or that concern you. MAKE SURE YOU:   Understand these instructions.  Will watch your condition.  Will get help right away if you are not doing well or get worse.   This information is not intended to replace advice given to you by your health care provider. Make sure you discuss any questions you have with your health care provider.   Document Released: 11/03/2005 Document Revised: 01/26/2012 Document Reviewed: 07/08/2013 Elsevier Interactive Patient Education 2016 [place] therapy can help ease sore, stiff, injured, and tight muscles and joints. Heat relaxes your muscles, which may help ease your pain.  RISKS AND COMPLICATIONS If you have any of the following conditions, do not use heat therapy unless your health care provider has approved:  Poor circulation.  Healing wounds or scarred skin in the area being treated.  Diabetes, heart disease, or high blood pressure.  Not being able to feel (numbness) the area being treated.  Unusual swelling of the area being treated.  Active infections.  Blood clots.  Cancer.  Inability to communicate pain. This may include young children and people who have problems with their brain function (dementia).  Pregnancy. Heat therapy should only be used on old, pre-existing, or long-lasting (chronic) injuries. Do not use heat  therapy on new injuries unless directed by your health care provider. HOW TO USE HEAT THERAPY There are several different kinds of heat therapy, including:  Moist heat pack.  Warm water bath.  Hot water bottle.  Electric heating pad.  Heated gel pack.  Heated wrap.  Electric heating pad. Use the heat therapy method suggested by your health care provider. Follow your health  care provider's instructions on when and how to use heat therapy. GENERAL HEAT THERAPY RECOMMENDATIONS  Do not sleep while using heat therapy. Only use heat therapy while you are awake.  Your skin may turn pink while using heat therapy. Do not use heat therapy if your skin turns red.  Do not use heat therapy if you have new pain.  High heat or long exposure to heat can cause burns. Be careful when using heat therapy to avoid burning your skin.  Do not use heat therapy on areas of your skin that are already irritated, such as with a rash or sunburn. SEEK MEDICAL CARE IF:  You have blisters, redness, swelling, or numbness.  You have new pain.  Your pain is worse. MAKE SURE YOU:  Understand these instructions.  Will watch your condition.  Will get help right away if you are not doing well or get worse.   This information is not intended to replace advice given to you by your health care provider. Make sure you discuss any questions you have with your health care provider.   Document Released: 01/26/2012 Document Revised: 11/24/2014 Document Reviewed: 12/27/2013 Elsevier Interactive Patient Education [employer].

## 2015-10-08 NOTE — ED Notes (Signed)
Pt. Requested to have BP rechecked again.

## 2015-10-08 NOTE — ED Notes (Signed)
Pt offered pain meds at triage but declined.

## 2016-02-07 ENCOUNTER — Encounter: Payer: Self-pay | Admitting: Gastroenterology

## 2016-03-19 ENCOUNTER — Encounter (HOSPITAL_COMMUNITY): Payer: Self-pay | Admitting: *Deleted

## 2016-03-19 ENCOUNTER — Emergency Department (HOSPITAL_COMMUNITY)
Admission: EM | Admit: 2016-03-19 | Discharge: 2016-03-19 | Disposition: A | Discharge status: Home / Self Care | Payer: Medicaid Other | Attending: Emergency Medicine | Admitting: Emergency Medicine

## 2016-03-19 DIAGNOSIS — Z8639 Personal history of other endocrine, nutritional and metabolic disease: Secondary | ICD-10-CM | POA: Insufficient documentation

## 2016-03-19 DIAGNOSIS — Z8619 Personal history of other infectious and parasitic diseases: Secondary | ICD-10-CM | POA: Insufficient documentation

## 2016-03-19 DIAGNOSIS — Z87891 Personal history of nicotine dependence: Secondary | ICD-10-CM | POA: Insufficient documentation

## 2016-03-19 DIAGNOSIS — R0981 Nasal congestion: Secondary | ICD-10-CM | POA: Insufficient documentation

## 2016-03-19 DIAGNOSIS — K219 Gastro-esophageal reflux disease without esophagitis: Secondary | ICD-10-CM | POA: Insufficient documentation

## 2016-03-19 DIAGNOSIS — Z8659 Personal history of other mental and behavioral disorders: Secondary | ICD-10-CM | POA: Insufficient documentation

## 2016-03-19 DIAGNOSIS — Z862 Personal history of diseases of the blood and blood-forming organs and certain disorders involving the immune mechanism: Secondary | ICD-10-CM | POA: Insufficient documentation

## 2016-03-19 DIAGNOSIS — G44209 Tension-type headache, unspecified, not intractable: Secondary | ICD-10-CM | POA: Insufficient documentation

## 2016-03-19 DIAGNOSIS — Z79899 Other long term (current) drug therapy: Secondary | ICD-10-CM | POA: Insufficient documentation

## 2016-03-19 LAB — URINALYSIS, ROUTINE W REFLEX MICROSCOPIC
BILIRUBIN URINE: NEGATIVE
Glucose, UA: NEGATIVE mg/dL
HGB URINE DIPSTICK: NEGATIVE
KETONES UR: NEGATIVE mg/dL
Leukocytes, UA: NEGATIVE
Nitrite: NEGATIVE
PROTEIN: NEGATIVE mg/dL
Specific Gravity, Urine: 1.012 (ref 1.005–1.030)
pH: 5.5 (ref 5.0–8.0)

## 2016-03-19 NOTE — Discharge Instructions (Signed)
Tension Headache A tension headache is a feeling of pain, pressure, or aching that is often felt over the front and sides of the head. The pain can be dull, or it can feel tight (constricting). Tension headaches are not normally associated with nausea or vomiting, and they do not get worse with physical activity. Tension headaches can last from 30 minutes to several days. This is the most common type of headache. CAUSES The exact cause of this condition is not known. Tension headaches often begin after stress, anxiety, or depression. Other triggers may include:  Alcohol.  Too much caffeine, or caffeine withdrawal.  Respiratory infections, such as colds, flu, or sinus infections.  Dental problems or teeth clenching.  Fatigue.  Holding your head and neck in the same position for a long period of time, such as while using a computer.  Smoking. SYMPTOMS Symptoms of this condition include:  A feeling of pressure around the head.  Dull, aching head pain.  Pain felt over the front and sides of the head.  Tenderness in the muscles of the head, neck, and shoulders. DIAGNOSIS This condition may be diagnosed based on your symptoms and a physical exam. Tests may be done, such as a CT scan or an MRI of your head. These tests may be done if your symptoms are severe or unusual. TREATMENT This condition may be treated with lifestyle changes and medicines to help relieve symptoms. HOME CARE INSTRUCTIONS Managing Pain  Take over-the-counter and prescription medicines only as told by your health care provider.  Lie down in a dark, quiet room when you have a headache.  If directed, apply ice to the head and neck area:  Put ice in a plastic bag.  Place a towel between your skin and the bag.  Leave the ice on for 20 minutes, 2-3 times per day.  Use a heating pad or a hot shower to apply heat to the head and neck area as told by your health care provider. Eating and Drinking  Eat meals on  a regular schedule.  Limit alcohol use.  Decrease your caffeine intake, or stop using caffeine. General Instructions  Keep all follow-up visits as told by your health care provider. This is important.  Keep a headache journal to help find out what may trigger your headaches. For example, write down:  What you eat and drink.  How much sleep you get.  Any change to your diet or medicines.  Try massage or other relaxation techniques.  Limit stress.  Sit up straight, and avoid tensing your muscles.  Do not use tobacco products, including cigarettes, chewing tobacco, or e-cigarettes. If you need help quitting, ask your health care provider.  Exercise regularly as told by your health care provider.  Get 7-9 hours of sleep, or the amount recommended by your health care provider. SEEK MEDICAL CARE IF:  Your symptoms are not helped by medicine.  You have a headache that is different from what you normally experience.  You have nausea or you vomit.  You have a fever. SEEK IMMEDIATE MEDICAL CARE IF:  Your headache becomes severe.  You have repeated vomiting.  You have a stiff neck.  You have a loss of vision.  You have problems with speech.  You have pain in your eye or ear.  You have muscular weakness or loss of muscle control.  You lose your balance or you have trouble walking.  You feel faint or you pass out.  You have confusion.  This information is not intended to replace advice given to you by your health care provider. Make sure you discuss any questions you have with your health care provider.   Document Released: 11/03/2005 Document Revised: 07/25/2015 Document Reviewed: 02/26/2015 Elsevier Interactive Patient Education 2016 [place] Headache Without Cause A headache is pain or discomfort felt around the head or neck area. There are many causes and types of headaches. In some cases, the cause may not be found.  HOME CARE  Managing  Pain  Take over-the-counter and prescription medicines only as told by your doctor.  Lie down in a dark, quiet room when you have a headache.  If directed, apply ice to the head and neck area:  Put ice in a plastic bag.  Place a towel between your skin and the bag.  Leave the ice on for 20 minutes, 2-3 times per day.  Use a heating pad or hot shower to apply heat to the head and neck area as told by your doctor.  Keep lights dim if bright lights bother you or make your headaches worse. Eating and Drinking  Eat meals on a regular schedule.  Lessen how much alcohol you drink.  Lessen how much caffeine you drink, or stop drinking caffeine. General Instructions  Keep all follow-up visits as told by your doctor. This is important.  Keep a journal to find out if certain things bring on headaches. For example, write down:  What you eat and drink.  How much sleep you get.  Any change to your diet or medicines.  Relax by getting a massage or doing other relaxing activities.  Lessen stress.  Sit up straight. Do not tighten (tense) your muscles.  Do not use tobacco products. This includes cigarettes, chewing tobacco, or e-cigarettes. If you need help quitting, ask your doctor.  Exercise regularly as told by your doctor.  Get enough sleep. This often means 7-9 hours of sleep. GET HELP IF:  Your symptoms are not helped by medicine.  You have a headache that feels different than the other headaches.  You feel sick to your stomach (nauseous) or you throw up (vomit).  You have a fever. GET HELP RIGHT AWAY IF:   Your headache becomes really bad.  You keep throwing up.  You have a stiff neck.  You have trouble seeing.  You have trouble speaking.  You have pain in the eye or ear.  Your muscles are weak or you lose muscle control.  You lose your balance or have trouble walking.  You feel like you will pass out (faint) or you pass out.  You have confusion.    This information is not intended to replace advice given to you by your health care provider. Make sure you discuss any questions you have with your health care provider.   Document Released: 08/12/2008 Document Revised: 07/25/2015 Document Reviewed: 02/26/2015 Elsevier Interactive Patient Education [employer].

## 2016-03-19 NOTE — ED Provider Notes (Signed)
CSN: [id number]     Arrival date & time 03/19/16  1246 History   First MD Initiated Contact with Patient 03/19/16 1454     Chief Complaint  Patient presents with  . Headache  . Nasal Congestion     (Consider location/radiation/quality/duration/timing/severity/associated sxs/prior Treatment) HPI Comments: Patient complains of several weeks of bitemporal headache as well as increased anxiety. Has been on Ativan before in the past but is not taking anything currently. Does note increased stress recently. Recent URI symptoms 8 or 9 days with slight nasal drainage. Denies any facial pain or fever or chills. Used over-the-counter medications including a decongestant which was started yesterday which did somewhat improve his symptoms. No visual changes. No neck or emesis. Denies any depression  Patient is a 60 y.o. male presenting with headaches. The history is provided by the patient.  Headache   Past Medical History  Diagnosis Date  . IBS (irritable bowel syndrome)   . GERD (gastroesophageal reflux disease)   . Hyperlipidemia   . Genital herpes   . [facility] spotted fever   . Anemia   . Acid reflux   . Depression    Past Surgical History  Procedure Laterality Date  . Cholecystectomy     Family History  Problem Relation Age of Onset  . Diabetes Mother   . Stroke Father   . Skin cancer Sister   . Colon cancer Neg Hx   . Alcohol abuse Mother    Social History  Substance Use Topics  . Smoking status: Former Research scientist (life sciences)  . Smokeless tobacco: None  . Alcohol Use: No    Review of Systems  Neurological: Positive for headaches.  All other systems reviewed and are negative.     Allergies  Codeine  Home Medications   Prior to Admission medications   Medication Sig Start Date End Date Taking? Authorizing Provider  acetaminophen (TYLENOL) 500 MG tablet Take 500 mg by mouth every 6 (six) hours as needed for mild pain.    Yes Historical Provider, MD  pseudoephedrine  (SUDAFED) 30 MG tablet Take 30 mg by mouth every 4 (four) hours as needed for congestion.   Yes Historical Provider, MD  ranitidine (ZANTAC) 150 MG tablet Take 150 mg by mouth 2 (two) times daily.   Yes Historical Provider, MD  LORazepam (ATIVAN) 1 MG tablet Take 0.5 tablets (0.5 mg total) by mouth 3 (three) times daily as needed for anxiety. Patient not taking: Reported on 10/08/2015 09/07/13   Delos Haring, PA-C  methocarbamol (ROBAXIN) 500 MG tablet Take 1 tablet (500 mg total) by mouth 2 (two) times daily as needed for muscle spasms. Patient not taking: Reported on 03/19/2016 10/08/15   Waynetta Pean, PA-C  naproxen (NAPROSYN) 250 MG tablet Take 1 tablet (250 mg total) by mouth 2 (two) times daily with a meal. Patient not taking: Reported on 03/19/2016 10/08/15   Waynetta Pean, PA-C  omeprazole (PRILOSEC) 20 MG capsule Take 1 capsule (20 mg total) by mouth daily. Patient not taking: Reported on 03/19/2016 10/08/15   Waynetta Pean, PA-C   BP 155/81 mmHg  Pulse 63  Temp(Src) 98 F (36.7 C) (Oral)  Resp 20  SpO2 99% Physical Exam  Constitutional: He is oriented to person, place, and time. He appears well-developed and well-nourished.  Non-toxic appearance. No distress.  HENT:  Head: Normocephalic and atraumatic.  Eyes: Conjunctivae, EOM and lids are normal. Pupils are equal, round, and reactive to light.  Neck: Normal range of motion. Neck supple. No tracheal  deviation present. No thyroid mass present.  Cardiovascular: Normal rate, regular rhythm and normal heart sounds.  Exam reveals no gallop.   No murmur heard. Pulmonary/Chest: Effort normal and breath sounds normal. No stridor. No respiratory distress. He has no decreased breath sounds. He has no wheezes. He has no rhonchi. He has no rales.  Abdominal: Soft. Normal appearance and bowel sounds are normal. He exhibits no distension. There is no tenderness. There is no rebound and no CVA tenderness.  Musculoskeletal: Normal range of  motion. He exhibits no edema or tenderness.  Neurological: He is alert and oriented to person, place, and time. He has normal strength. No cranial nerve deficit or sensory deficit. GCS eye subscore is 4. GCS verbal subscore is 5. GCS motor subscore is 6.  Skin: Skin is warm and dry. No abrasion and no rash noted.  Psychiatric: He has a normal mood and affect. His speech is normal and behavior is normal.  Nursing note and vitals reviewed.   ED Course  Procedures (including critical care time) Labs Review Labs Reviewed - No data to display  Imaging Review No results found. I have personally reviewed and evaluated these images and lab results as part of my medical decision-making.   EKG Interpretation None      MDM   Final diagnoses:  None    Patient with likely tension-type headaches. No warning class for subarachnoid hemorrhage. Evidence of sinusitis at this time. Will give referral for a primary care doctor and return precautions given    Lacretia Leigh, MD 03/19/16 1515

## 2016-03-19 NOTE — ED Notes (Addendum)
Pt complains of headaches for the past several weeks, which became worse last week along with nasal congestion.  Pt states he has also had increased panic attacks for the past 6 months. Pt states he has hx of panic attacks and was on .5mg  ativan at one time.

## 2018-08-11 ENCOUNTER — Encounter (HOSPITAL_COMMUNITY): Payer: Self-pay | Admitting: Emergency Medicine

## 2018-08-11 ENCOUNTER — Emergency Department (HOSPITAL_COMMUNITY)
Admission: EM | Admit: 2018-08-11 | Discharge: 2018-08-11 | Disposition: A | Discharge status: Home / Self Care | Payer: Self-pay | Attending: Emergency Medicine | Admitting: Emergency Medicine

## 2018-08-11 DIAGNOSIS — W57XXXA Bitten or stung by nonvenomous insect and other nonvenomous arthropods, initial encounter: Secondary | ICD-10-CM

## 2018-08-11 DIAGNOSIS — Z79899 Other long term (current) drug therapy: Secondary | ICD-10-CM | POA: Insufficient documentation

## 2018-08-11 DIAGNOSIS — M791 Myalgia, unspecified site: Secondary | ICD-10-CM | POA: Insufficient documentation

## 2018-08-11 DIAGNOSIS — Z87891 Personal history of nicotine dependence: Secondary | ICD-10-CM | POA: Insufficient documentation

## 2018-08-11 DIAGNOSIS — R109 Unspecified abdominal pain: Secondary | ICD-10-CM | POA: Insufficient documentation

## 2018-08-11 LAB — CK: CK TOTAL: 170 U/L (ref 49–397)

## 2018-08-11 LAB — CBC
HCT: 44.3 % (ref 39.0–52.0)
HEMOGLOBIN: 15 g/dL (ref 13.0–17.0)
MCH: 33.4 pg (ref 26.0–34.0)
MCHC: 33.9 g/dL (ref 30.0–36.0)
MCV: 98.7 fL (ref 78.0–100.0)
Platelets: 166 10*3/uL (ref 150–400)
RBC: 4.49 MIL/uL (ref 4.22–5.81)
RDW: 11.6 % (ref 11.5–15.5)
WBC: 7.1 10*3/uL (ref 4.0–10.5)

## 2018-08-11 LAB — URINALYSIS, ROUTINE W REFLEX MICROSCOPIC
Bilirubin Urine: NEGATIVE
Glucose, UA: NEGATIVE mg/dL
KETONES UR: 15 mg/dL — AB
LEUKOCYTES UA: NEGATIVE
Nitrite: NEGATIVE
Protein, ur: NEGATIVE mg/dL
Specific Gravity, Urine: 1.02 (ref 1.005–1.030)
pH: 5.5 (ref 5.0–8.0)

## 2018-08-11 LAB — BASIC METABOLIC PANEL
ANION GAP: 8 (ref 5–15)
BUN: 21 mg/dL (ref 8–23)
CALCIUM: 9.8 mg/dL (ref 8.9–10.3)
CO2: 26 mmol/L (ref 22–32)
CREATININE: 1.16 mg/dL (ref 0.61–1.24)
Chloride: 105 mmol/L (ref 98–111)
GFR calc non Af Amer: >60 mL/min (ref >60)
Glucose, Bld: 122 mg/dL — ABNORMAL HIGH (ref 70–99)
Potassium: 4.3 mmol/L (ref 3.5–5.1)
SODIUM: 139 mmol/L (ref 135–145)

## 2018-08-11 LAB — URINALYSIS, MICROSCOPIC (REFLEX)
Squamous Epithelial / LPF: NONE SEEN (ref 0–5)
WBC UA: NONE SEEN WBC/hpf (ref 0–5)

## 2018-08-11 MED ORDER — DOXYCYCLINE HYCLATE 100 MG PO CAPS: 100.0000 mg | ORAL_CAPSULE | Freq: Two times a day (BID) | ORAL | 0 refills | Status: DC

## 2018-08-11 NOTE — Discharge Instructions (Signed)
We signed the ER for tick bite exposure and recent episodes of body aches, chills, weakness. Some of the symptoms could be because of [facility] spotted fever. Center of disease control recommends that patients who have had tick exposure in endemic area with symptoms that could be because of RMSF -should get doxycycline treatment.  Please start taking the antibiotics prescribed. See a primary care doctor for further evaluation. Return to the ER if your symptoms get worse.

## 2018-08-11 NOTE — ED Triage Notes (Signed)
Pt presents to ED for assessment of left flank for 3 weeks with worsening x 1 week, with malaise, fatigue, chills, jitters, muscle aches.  Denies obvious changes to urination

## 2018-08-13 LAB — ROCKY MTN SPOTTED FVR ABS PNL(IGG+IGM)
RMSF IGM: 0.37 {index} (ref 0.00–0.89)
RMSF IgG: POSITIVE — AB

## 2018-08-13 LAB — RMSF, IGG, IFA

## 2018-08-13 NOTE — ED Provider Notes (Signed)
[place] EMERGENCY DEPARTMENT Provider Note   CSN: [id number] Arrival date & time: 08/11/18  1159     History   Chief Complaint Chief Complaint  Patient presents with  . Flank Pain    HPI Shane Reynolds is a 62 y.o. male.  HPI  62 year old male comes in with chief complaint of flank pain. Patient reports that over the past 3 weeks he has been having flank pain which has gotten worse.  He is also having weakness, chills, malaise, body aches.  Patient does not have any pain with urination, blood in the urine denies any trauma.  Patient has history of RMSF.  He denies any recent exposure that he is aware of.  Past Medical History:  Diagnosis Date  . Acid reflux   . Anemia   . Depression   . Genital herpes   . GERD (gastroesophageal reflux disease)   . Hyperlipidemia   . IBS (irritable bowel syndrome)   . [place] spotted fever     Patient Active Problem List   Diagnosis Date Noted  . Personal history of colonic polyps 08/25/2011  . ANXIETY 09/30/2010  . HEMORRHOIDS, INTERNAL 04/08/2010  . SCIATICA 04/08/2010  . HYPERLIPIDEMIA 03/18/2010  . GERD 03/18/2010  . IRRITABLE BOWEL SYNDROME 03/18/2010    Past Surgical History:  Procedure Laterality Date  . CHOLECYSTECTOMY          Home Medications    Prior to Admission medications   Medication Sig Start Date End Date Taking? Authorizing Provider  acetaminophen (TYLENOL) 500 MG tablet Take 500 mg by mouth every 6 (six) hours as needed for mild pain.     [provider]  doxycycline (VIBRAMYCIN) 100 MG capsule Take 1 capsule (100 mg total) by mouth 2 (two) times daily. 08/11/18   Varney Biles, MD  LORazepam (ATIVAN) 1 MG tablet Take 0.5 tablets (0.5 mg total) by mouth 3 (three) times daily as needed for anxiety. Patient not taking: Reported on 10/08/2015 09/07/13   Delos Haring, PA-C  methocarbamol (ROBAXIN) 500 MG tablet Take 1 tablet (500 mg total) by mouth 2 (two)  times daily as needed for muscle spasms. Patient not taking: Reported on 03/19/2016 10/08/15   Waynetta Pean, PA-C  naproxen (NAPROSYN) 250 MG tablet Take 1 tablet (250 mg total) by mouth 2 (two) times daily with a meal. Patient not taking: Reported on 03/19/2016 10/08/15   Waynetta Pean, PA-C  omeprazole (PRILOSEC) 20 MG capsule Take 1 capsule (20 mg total) by mouth daily. Patient not taking: Reported on 03/19/2016 10/08/15   Waynetta Pean, PA-C  pseudoephedrine (SUDAFED) 30 MG tablet Take 30 mg by mouth every 4 (four) hours as needed for congestion.    [provider]  ranitidine (ZANTAC) 150 MG tablet Take 150 mg by mouth 2 (two) times daily.    [provider]    Family History Family History  Problem Relation Age of Onset  . Diabetes Mother   . Alcohol abuse Mother   . Stroke Father   . Skin cancer Sister   . Colon cancer Neg Hx     Social History Social History   Tobacco Use  . Smoking status: Former Research scientist (life sciences)  . Smokeless tobacco: Never Used  Substance Use Topics  . Alcohol use: No  . Drug use: No     Allergies   Codeine   Review of Systems Review of Systems  Constitutional: Positive for activity change and fatigue.  Respiratory: Negative for shortness of breath.  Cardiovascular: Negative for chest pain.  Gastrointestinal: Negative for nausea and vomiting.  Genitourinary: Negative for dysuria.  Allergic/Immunologic: Negative for immunocompromised state.  Hematological: Does not bruise/bleed easily.     Physical Exam Updated Vital Signs BP (!) 177/86 (BP Location: Right Arm)   Pulse (!) 57   Temp 98.9 F (37.2 C) (Oral)   Resp 16   SpO2 100%   Physical Exam  Constitutional: He is oriented to person, place, and time. He appears well-developed.  HENT:  Head: Atraumatic.  Neck: Neck supple.  Cardiovascular: Normal rate.  Pulmonary/Chest: Effort normal.  Abdominal: Soft.  Reproducible tenderness over the left flank region No  ecchymosis.  Neurological: He is alert and oriented to person, place, and time.  Skin: Skin is warm.  Nursing note and vitals reviewed.    ED Treatments / Results  Labs (all labs ordered are listed, but only abnormal results are displayed) Labs Reviewed  URINALYSIS, ROUTINE W REFLEX MICROSCOPIC - Abnormal; Notable for the following components:      Result Value   Hgb urine dipstick TRACE (*)    Ketones, ur 15 (*)    All other components within normal limits  BASIC METABOLIC PANEL - Abnormal; Notable for the following components:   Glucose, Bld 122 (*)    All other components within normal limits  [place] MTN SPOTTED FVR ABS PNL(IGG+IGM) - Abnormal; Notable for the following components:   RMSF IgG Positive (*)    All other components within normal limits  URINALYSIS, MICROSCOPIC (REFLEX) - Abnormal; Notable for the following components:   Bacteria, UA RARE (*)    All other components within normal limits  RMSF, IGG, IFA - Abnormal; Notable for the following components:   RMSF, IGG, IFA 1:64 (*)    All other components within normal limits  CBC  CK    EKG None  Radiology No results found.  Procedures Procedures (including critical care time)  Medications Ordered in ED Medications - No data to display   Initial Impression / Assessment and Plan / ED Course  I have reviewed the triage vital signs and the nursing notes.  Pertinent labs & imaging results that were available during my care of the patient were reviewed by me and considered in my medical decision making (see chart for details).     62 year old male comes in with chief complaint of flank pain. Patient is also having some URI-like symptoms and subjective fevers.  We ordered basic labs and CK which are normal. No UTI-like symptoms, with therefore we do not think he has pyelonephritis.  Symptoms are not consistent with ureteral colic either.  Given the flulike illness in the summertime, we did check for RMSF  and Lyme disease.  Patient will be started on doxycycline..  Final Clinical Impressions(s) / ED Diagnoses   Final diagnoses:  Flank pain  Myalgia  Tick bite, initial encounter    ED Discharge Orders         Ordered    doxycycline (VIBRAMYCIN) 100 MG capsule  2 times daily     08/11/18 1533           Varney Biles, MD 08/13/18 1835

## 2018-08-14 ENCOUNTER — Other Ambulatory Visit: Payer: Self-pay

## 2018-08-14 ENCOUNTER — Emergency Department (HOSPITAL_COMMUNITY)
Admission: EM | Admit: 2018-08-14 | Discharge: 2018-08-14 | Disposition: A | Discharge status: Home / Self Care | Payer: Self-pay | Attending: Emergency Medicine | Admitting: Emergency Medicine

## 2018-08-14 ENCOUNTER — Emergency Department (HOSPITAL_COMMUNITY): Payer: Self-pay

## 2018-08-14 DIAGNOSIS — T7840XA Allergy, unspecified, initial encounter: Secondary | ICD-10-CM | POA: Insufficient documentation

## 2018-08-14 DIAGNOSIS — Z87891 Personal history of nicotine dependence: Secondary | ICD-10-CM | POA: Insufficient documentation

## 2018-08-14 DIAGNOSIS — R21 Rash and other nonspecific skin eruption: Secondary | ICD-10-CM | POA: Insufficient documentation

## 2018-08-14 MED ORDER — DIPHENHYDRAMINE HCL 25 MG PO CAPS
25.0000 mg | ORAL_CAPSULE | Freq: Once | ORAL | Status: AC
  Administered 2018-08-14: 25 mg via ORAL
  Filled 2018-08-14: qty 1

## 2018-08-14 MED ORDER — PREDNISONE 20 MG PO TABS
60.0000 mg | ORAL_TABLET | Freq: Once | ORAL | Status: AC
  Administered 2018-08-14: 60 mg via ORAL
  Filled 2018-08-14: qty 3

## 2018-08-14 MED ORDER — PREDNISONE 20 MG PO TABS: 40.0000 mg | ORAL_TABLET | Freq: Every day | ORAL | 0 refills | Status: AC

## 2018-08-14 MED ORDER — FAMOTIDINE 20 MG PO TABS
20.0000 mg | ORAL_TABLET | Freq: Once | ORAL | Status: AC
  Administered 2018-08-14: 20 mg via ORAL
  Filled 2018-08-14: qty 1

## 2018-08-14 MED ORDER — DIPHENHYDRAMINE HCL 25 MG PO TABS: 25.0000 mg | ORAL_TABLET | Freq: Four times a day (QID) | ORAL | 0 refills | Status: AC

## 2018-08-14 MED ORDER — FAMOTIDINE 20 MG PO TABS: 20.0000 mg | ORAL_TABLET | Freq: Two times a day (BID) | ORAL | 0 refills | Status: DC

## 2018-08-14 NOTE — ED Notes (Signed)
Patient transported to X-ray 

## 2018-08-14 NOTE — ED Triage Notes (Signed)
Pt reports he was given doxycycline Wednesday when he came for eval of RMSF, was called yesterday and told to start med because test came back positive. Pt reports he woke up this am with welts to torso and redness to his penis, has taken doxy before with no adverse reactions, no sob or swelling present.

## 2018-08-14 NOTE — ED Notes (Signed)
Pt reports waking up with welts on his torso and redness on his groin area this am.  He started to take doxycylcine yesterday for RMSF

## 2018-08-14 NOTE — ED Notes (Signed)
Pt ambulates independently to bathroom (RR9)

## 2018-08-15 NOTE — ED Provider Notes (Signed)
[place] EMERGENCY DEPARTMENT Provider Note   CSN: [id number] Arrival date & time: 08/14/18  1037     History   Chief Complaint Chief Complaint  Patient presents with  . Rash  . Allergic Reaction    HPI Shane Reynolds is a 62 y.o. male.  HPI Patient is a 62 year old male presents to the emergency department after being initiated on doxycycline last night for possible positive [facility] spotted fever titer.  He presents the emergency department with new itchy rash of his perineum scrotum bilateral thighs and posterior thighs.  He reports it itches.  There are hives that are coming and going on his legs.  He is tried Benadryl without improvement.  No fevers or chills.  Reports he woke up in the middle the night itching his perineal and scrotal region.  No new soaps.  No history of significant allergies or eczema or asthma.  Patient is 89 and otherwise relatively healthy.  He is without a primary care physician.   Past Medical History:  Diagnosis Date  . Acid reflux   . Anemia   . Depression   . Genital herpes   . GERD (gastroesophageal reflux disease)   . Hyperlipidemia   . IBS (irritable bowel syndrome)   . [place] spotted fever     Patient Active Problem List   Diagnosis Date Noted  . Personal history of colonic polyps 08/25/2011  . ANXIETY 09/30/2010  . HEMORRHOIDS, INTERNAL 04/08/2010  . SCIATICA 04/08/2010  . HYPERLIPIDEMIA 03/18/2010  . GERD 03/18/2010  . IRRITABLE BOWEL SYNDROME 03/18/2010    Past Surgical History:  Procedure Laterality Date  . CHOLECYSTECTOMY          Home Medications    Prior to Admission medications   Medication Sig Start Date End Date Taking? Authorizing Provider  diphenhydrAMINE (BENADRYL) 25 MG tablet Take 1 tablet (25 mg total) by mouth every 6 (six) hours. 08/14/18   Jola Schmidt, MD  famotidine (PEPCID) 20 MG tablet Take 1 tablet (20 mg total) by mouth 2 (two) times daily. 08/14/18   Jola Schmidt, MD  predniSONE (DELTASONE) 20 MG tablet Take 2 tablets (40 mg total) by mouth daily for 5 days. 08/14/18 08/19/18  Jola Schmidt, MD  pseudoephedrine (SUDAFED) 30 MG tablet Take 30 mg by mouth every 4 (four) hours as needed for congestion.    [provider]  ranitidine (ZANTAC) 150 MG tablet Take 150 mg by mouth 2 (two) times daily.    [provider]    Family History Family History  Problem Relation Age of Onset  . Diabetes Mother   . Alcohol abuse Mother   . Stroke Father   . Skin cancer Sister   . Colon cancer Neg Hx     Social History Social History   Tobacco Use  . Smoking status: Former Research scientist (life sciences)  . Smokeless tobacco: Never Used  Substance Use Topics  . Alcohol use: No  . Drug use: No     Allergies   Codeine   Review of Systems Review of Systems  All other systems reviewed and are negative.    Physical Exam Updated Vital Signs BP (!) 177/95 (BP Location: Right Arm)   Pulse 61   Temp 98.9 F (37.2 C) (Oral)   Resp 18   Ht 6\' 3"  (1.905 m)   Wt 82.6 kg   SpO2 98%   BMI 22.75 kg/m   Physical Exam  Constitutional: He is oriented to person, place,  and time. He appears well-developed and well-nourished.  HENT:  Head: Normocephalic.  Eyes: EOM are normal.  Neck: Normal range of motion.  Pulmonary/Chest: Effort normal.  Abdominal: He exhibits no distension.  Genitourinary:  Genitourinary Comments: General irritation and hives to the scrotum penis perineal region as well as medial proximal thighs bilaterally  Musculoskeletal: Normal range of motion.  Neurological: He is alert and oriented to person, place, and time.  Psychiatric: He has a normal mood and affect.  Nursing note and vitals reviewed.    ED Treatments / Results  Labs (all labs ordered are listed, but only abnormal results are displayed) Labs Reviewed - No data to display  EKG None  Radiology Dg Chest 2 View  Result Date: 08/14/2018 CLINICAL DATA:  Left rib  pain for 1 month EXAM: CHEST - 2 VIEW COMPARISON:  05/05/2011 FINDINGS: Normal heart size. Lungs hyperaerated and clear. No pneumothorax. No pleural effusion. IMPRESSION: No active cardiopulmonary disease. Electronically Signed   By: Marybelle Killings M.D.   On: 08/14/2018 14:33    Procedures Procedures (including critical care time)  Medications Ordered in ED Medications  famotidine (PEPCID) tablet 20 mg (20 mg Oral Given 08/14/18 1350)  diphenhydrAMINE (BENADRYL) capsule 25 mg (25 mg Oral Given 08/14/18 1350)  predniSONE (DELTASONE) tablet 60 mg (60 mg Oral Given 08/14/18 1351)     Initial Impression / Assessment and Plan / ED Course  I have reviewed the triage vital signs and the nursing notes.  Pertinent labs & imaging results that were available during my care of the patient were reviewed by me and considered in my medical decision making (see chart for details).     Throughout the emergency department stay the patient has had several different area of hives develop on his lower extremities.  He will be treated as allergic reaction with Benadryl Pepcid prednisone.  There is no signs to suggest cellulitis or developing infection at this time.  I personally reviewed the patient's [facility] spotted fever titers which demonstrates a positive IgG result with a titer of 1:64 which is positive which corresponds to "suggestive of past or possible current infection".  Clinically there is no evidence of RMSF and he appears to be having allergic reaction likely to the initiation of doxycycline last night.  I believe the doxycycline can be discontinued safely.  Final Clinical Impressions(s) / ED Diagnoses   Final diagnoses:  Allergic reaction, initial encounter    ED Discharge Orders         Ordered    predniSONE (DELTASONE) 20 MG tablet  Daily     08/14/18 1527    famotidine (PEPCID) 20 MG tablet  2 times daily     08/14/18 1527    diphenhydrAMINE (BENADRYL) 25 MG tablet  Every 6 hours      08/14/18 [place], Jessah Danser, MD 08/15/18 [phone]

## 2018-10-25 ENCOUNTER — Other Ambulatory Visit: Payer: Self-pay | Admitting: Orthopedic Surgery

## 2018-10-25 DIAGNOSIS — S32010A Wedge compression fracture of first lumbar vertebra, initial encounter for closed fracture: Secondary | ICD-10-CM

## 2018-10-26 ENCOUNTER — Ambulatory Visit
Admission: RE | Admit: 2018-10-26 | Discharge: 2018-10-26 | Disposition: A | Discharge status: Home / Self Care | Payer: Self-pay | Source: Ambulatory Visit | Attending: Orthopedic Surgery | Admitting: Orthopedic Surgery

## 2018-10-26 DIAGNOSIS — M47816 Spondylosis without myelopathy or radiculopathy, lumbar region: Secondary | ICD-10-CM | POA: Insufficient documentation

## 2018-10-26 DIAGNOSIS — M48061 Spinal stenosis, lumbar region without neurogenic claudication: Secondary | ICD-10-CM | POA: Insufficient documentation

## 2018-10-26 DIAGNOSIS — S32010A Wedge compression fracture of first lumbar vertebra, initial encounter for closed fracture: Secondary | ICD-10-CM | POA: Insufficient documentation

## 2019-06-19 IMAGING — MR MR LUMBAR SPINE W/O CM
5 series · 31 of 48 positions shown · non-contrast
Comparison: 09/07/2013 CT abdomen and pelvis.

CLINICAL DATA: 62 y/o M; felt a pop when reaching up. Mid to lower
back pain for 13 weeks.

EXAM:
MRI LUMBAR SPINE WITHOUT CONTRAST
TECHNIQUE: Multiplanar, multisequence MR imaging of the lumbar spine was
performed. No intravenous contrast was administered.

[Series 5: T2 · sagittal · 4.0mm · 0.81mm/px · 6 of 17 slices shown (1 of 2)]
[im 1/17]
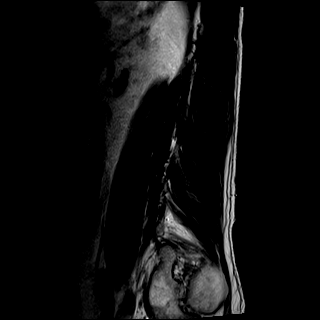
[im 4/17]
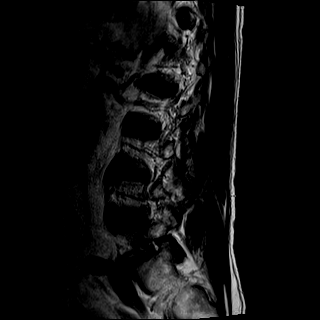
[im 7/17]
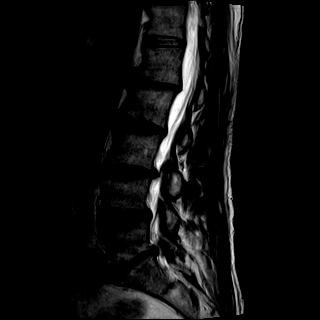
[im 10/17]
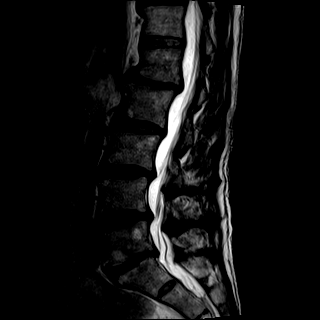
[im 13/17]
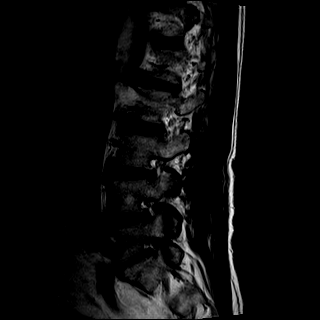
[im 17/17]
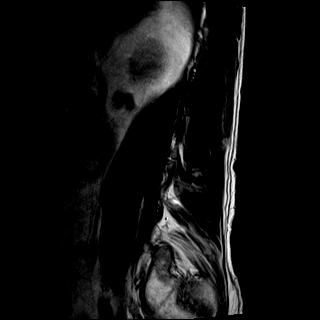

[Series 6: T1 · sagittal · 4.0mm · 0.81mm/px · 6 of 17 slices shown (1 of 2)]
[im 1/17]
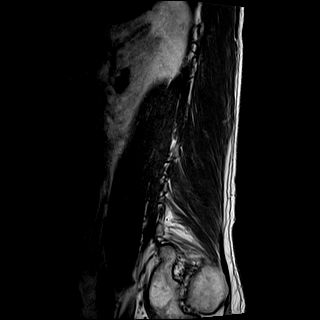
[im 4/17]
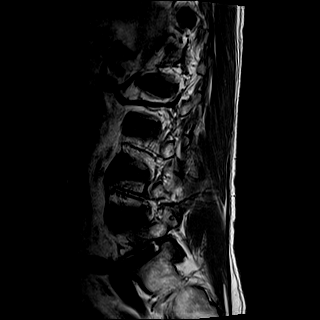
[im 7/17]
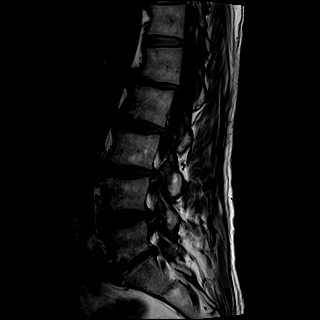
[im 10/17]
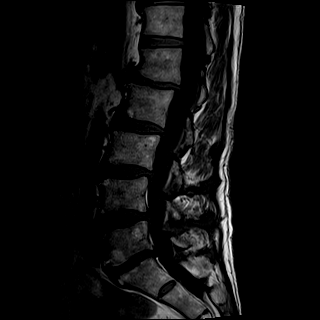
[im 13/17]
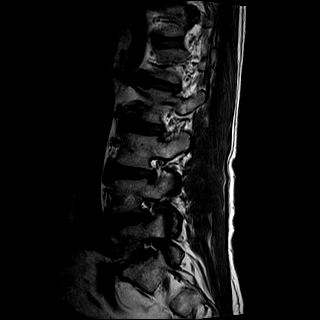
[im 17/17]
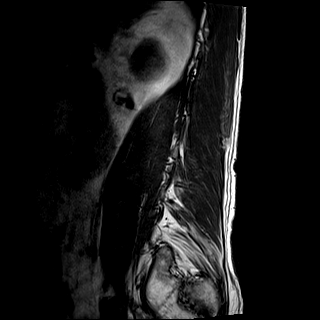

[Series 7: STIR · sagittal · 4.0mm · 0.41mm/px · 1 of 17 slices shown]
[im 1/17]
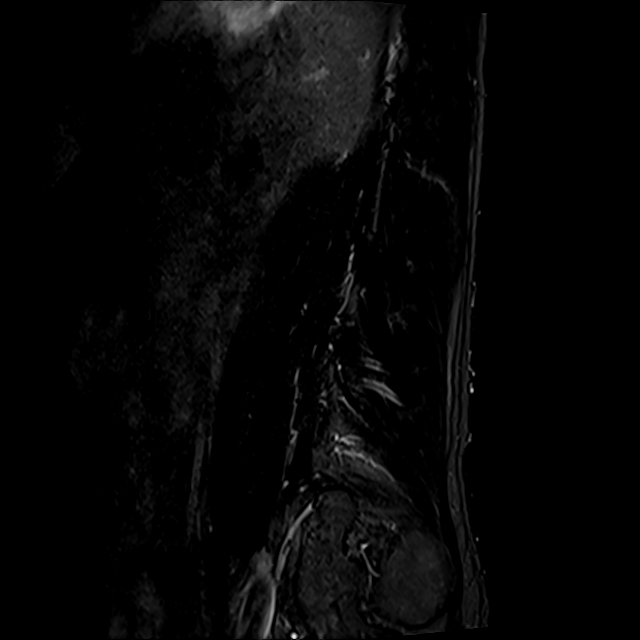

[Series 8: T2 · axial · 4.0mm · 0.78mm/px · z∈[-74,+155]mm · 9 of 39 slices shown (2 of 2)]
[im 1/39]
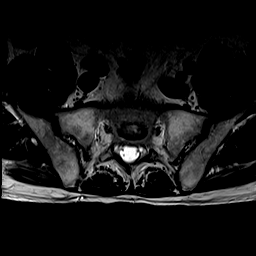
[im 6/39]
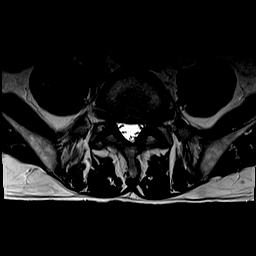
[im 11/39]
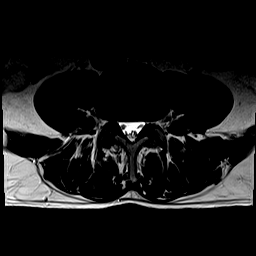
[im 17/39]
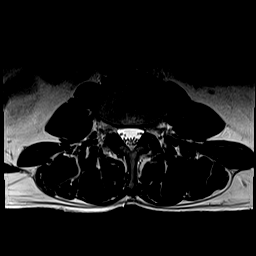
[im 20/39]
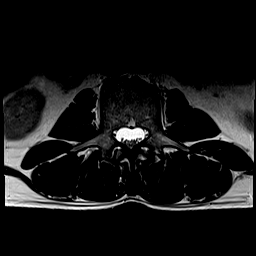
[im 22/39]
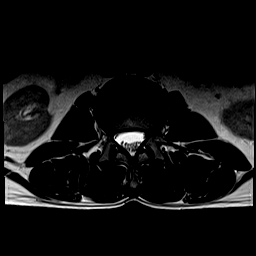
[im 28/39]
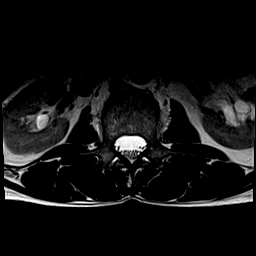
[im 33/39]
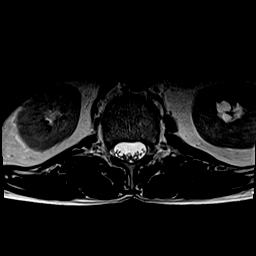
[im 39/39]
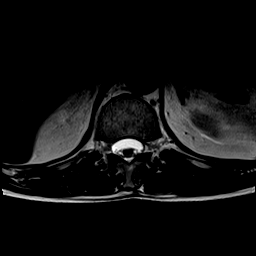

[Series 9: T1 · axial · 4.0mm · 0.39mm/px · z∈[-74,+155]mm · 9 of 39 slices shown (2 of 2)]
[im 1/39]
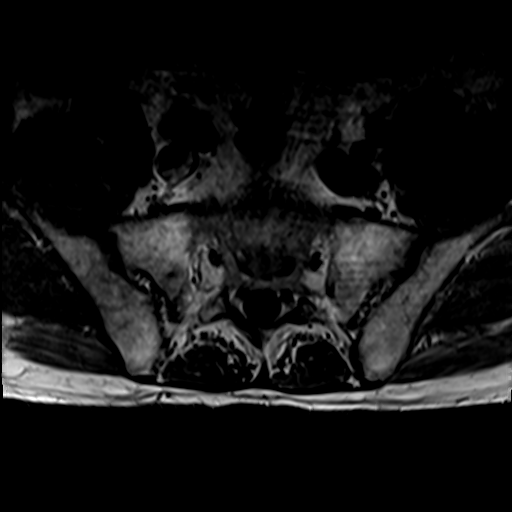
[im 6/39]
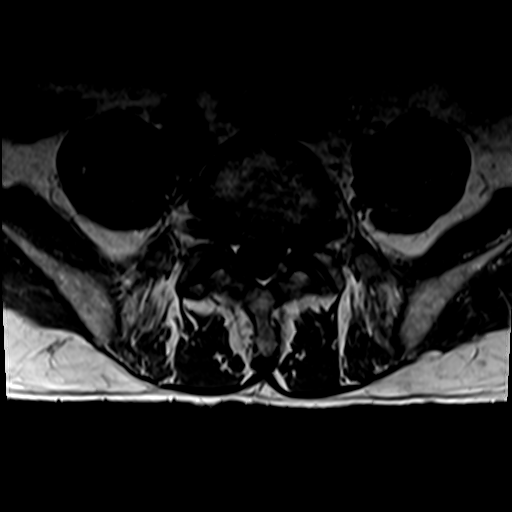
[im 11/39]
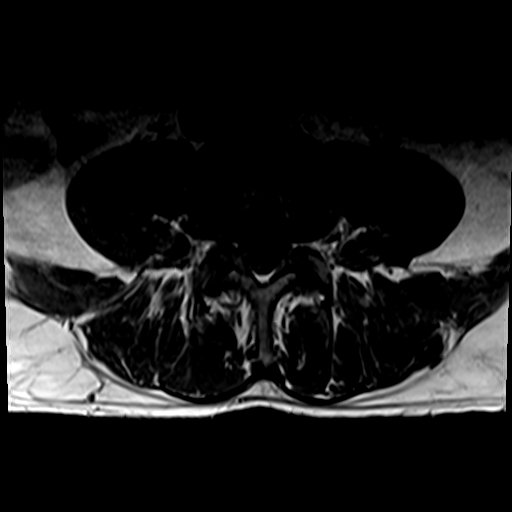
[im 17/39]
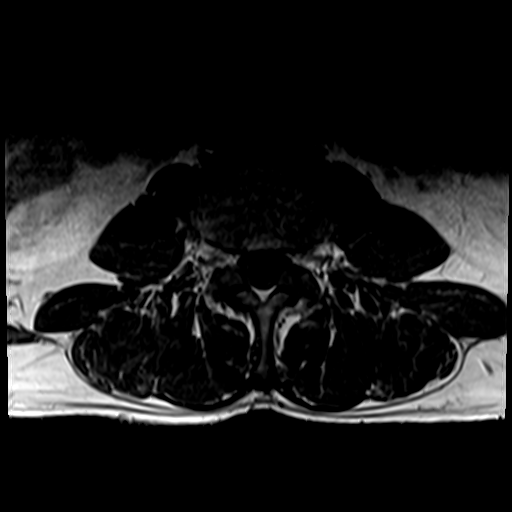
[im 20/39]
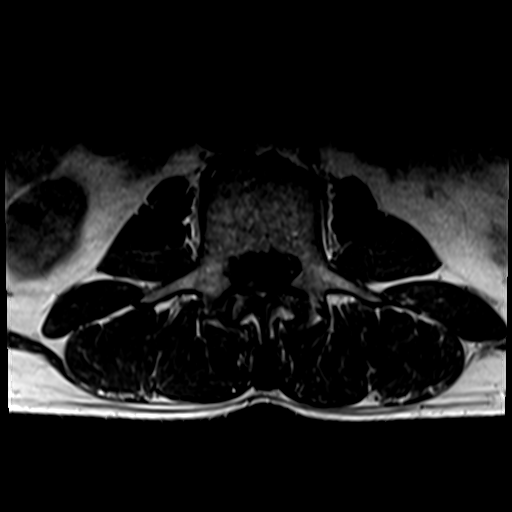
[im 22/39]
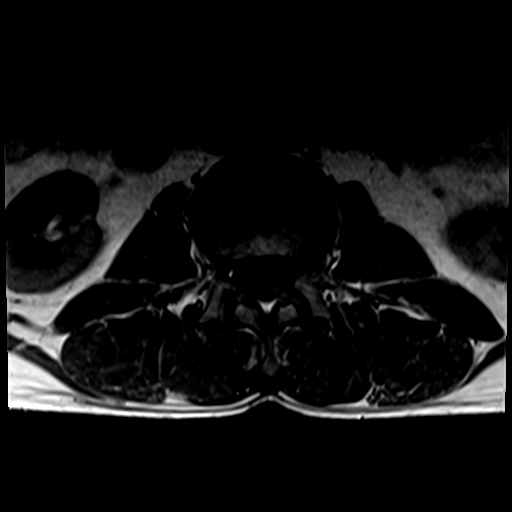
[im 28/39]
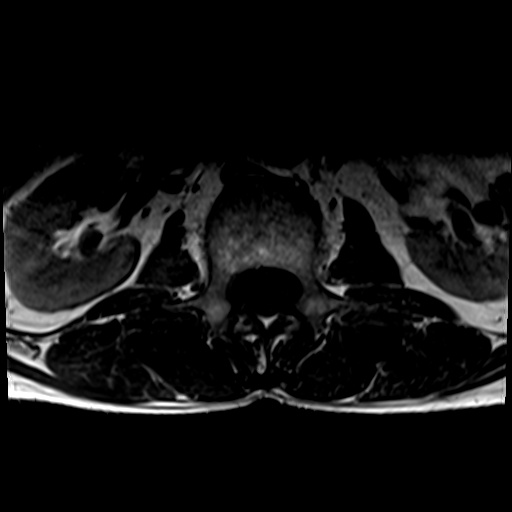
[im 33/39]
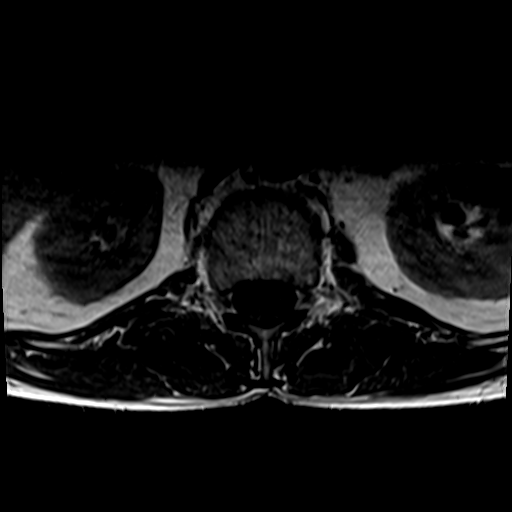
[im 39/39]
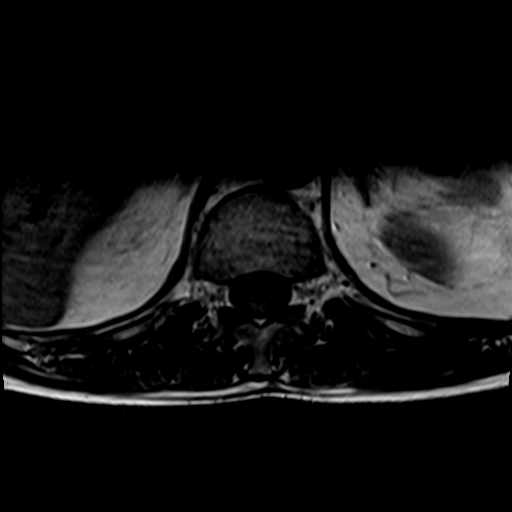

[31 of 48 positions shown; findings below may reference images not displayed]

FINDINGS: Segmentation:  Standard.

Alignment:  Physiologic.

Vertebrae:  No fracture, evidence of discitis, or bone lesion.

Conus medullaris and cauda equina: Conus extends to the L1 level.
Conus and cauda equina appear normal.

Paraspinal and other soft tissues: Negative.

Disc levels:

T12-L1: No significant disc displacement, foraminal stenosis, or
canal stenosis.

L1-2: Mild disc bulge eccentric to the right and facet hypertrophy.
No significant foraminal or canal stenosis.

L2-3: Mild disc bulge and facet hypertrophy. No significant
foraminal or canal stenosis.

L3-4: Moderate disc bulge and mild facet hypertrophy. Mild bilateral
foraminal stenosis. No canal stenosis.

L4-5: Moderate disc bulge and mild facet hypertrophy. Mild bilateral
foraminal stenosis. No canal stenosis.

L5-S1: Mild disc bulge with endplate marginal osteophytes extending
into the foramina, small left foraminal disc protrusion, and mild
bilateral facet hypertrophy. Moderate right and moderate to severe
left foraminal stenosis. No canal stenosis.
IMPRESSION: 1. No acute osseous abnormality or malalignment.
2. Lumbar spondylosis greatest at the L5-S1 level.
3. L5-S1 moderate right and moderate to severe left foraminal
stenosis. Mild bilateral L3-4 and L4-5 foraminal stenosis.
4. No significant canal stenosis.

## 2021-01-02 ENCOUNTER — Emergency Department (HOSPITAL_COMMUNITY)
Admission: EM | Admit: 2021-01-02 | Discharge: 2021-01-03 | Disposition: A | Discharge status: Home / Self Care | Payer: Self-pay | Attending: Emergency Medicine | Admitting: Emergency Medicine

## 2021-01-02 ENCOUNTER — Other Ambulatory Visit: Payer: Self-pay

## 2021-01-02 ENCOUNTER — Encounter (HOSPITAL_COMMUNITY): Payer: Self-pay | Admitting: Emergency Medicine

## 2021-01-02 DIAGNOSIS — Z9049 Acquired absence of other specified parts of digestive tract: Secondary | ICD-10-CM | POA: Insufficient documentation

## 2021-01-02 DIAGNOSIS — K581 Irritable bowel syndrome with constipation: Secondary | ICD-10-CM | POA: Insufficient documentation

## 2021-01-02 DIAGNOSIS — I10 Essential (primary) hypertension: Secondary | ICD-10-CM | POA: Insufficient documentation

## 2021-01-02 DIAGNOSIS — M545 Low back pain, unspecified: Secondary | ICD-10-CM | POA: Insufficient documentation

## 2021-01-02 DIAGNOSIS — Z87891 Personal history of nicotine dependence: Secondary | ICD-10-CM | POA: Insufficient documentation

## 2021-01-02 DIAGNOSIS — K219 Gastro-esophageal reflux disease without esophagitis: Secondary | ICD-10-CM | POA: Insufficient documentation

## 2021-01-02 LAB — URINALYSIS, ROUTINE W REFLEX MICROSCOPIC
Bilirubin Urine: NEGATIVE
Glucose, UA: NEGATIVE mg/dL
Hgb urine dipstick: NEGATIVE
Ketones, ur: 5 mg/dL — AB
Leukocytes,Ua: NEGATIVE
Nitrite: NEGATIVE
Protein, ur: NEGATIVE mg/dL
Specific Gravity, Urine: 1.013 (ref 1.005–1.030)
pH: 5 (ref 5.0–8.0)

## 2021-01-02 LAB — COMPREHENSIVE METABOLIC PANEL
ALT: 25 U/L (ref 0–44)
AST: 28 U/L (ref 15–41)
Albumin: 4.7 g/dL (ref 3.5–5.0)
Alkaline Phosphatase: 45 U/L (ref 38–126)
Anion gap: 11 (ref 5–15)
BUN: 23 mg/dL (ref 8–23)
CO2: 25 mmol/L (ref 22–32)
Calcium: 10.4 mg/dL — ABNORMAL HIGH (ref 8.9–10.3)
Chloride: 99 mmol/L (ref 98–111)
Creatinine, Ser: 1.16 mg/dL (ref 0.61–1.24)
GFR, Estimated: >60 mL/min (ref >60)
Glucose, Bld: 126 mg/dL — ABNORMAL HIGH (ref 70–99)
Potassium: 4.8 mmol/L (ref 3.5–5.1)
Sodium: 135 mmol/L (ref 135–145)
Total Bilirubin: 1.4 mg/dL — ABNORMAL HIGH (ref 0.3–1.2)
Total Protein: 7.9 g/dL (ref 6.5–8.1)

## 2021-01-02 LAB — LIPASE, BLOOD: Lipase: 32 U/L (ref 11–51)

## 2021-01-02 LAB — CBC
HCT: 50 % (ref 39.0–52.0)
Hemoglobin: 16.9 g/dL (ref 13.0–17.0)
MCH: 33 pg (ref 26.0–34.0)
MCHC: 33.8 g/dL (ref 30.0–36.0)
MCV: 97.7 fL (ref 80.0–100.0)
Platelets: 223 10*3/uL (ref 150–400)
RBC: 5.12 MIL/uL (ref 4.22–5.81)
RDW: 11.6 % (ref 11.5–15.5)
WBC: 8.2 10*3/uL (ref 4.0–10.5)
nRBC: 0 % (ref 0.0–0.2)

## 2021-01-02 NOTE — ED Triage Notes (Signed)
Arrived via EMS patient newly diagnosed with HTN and history of anxiety. States started new medication Losartan Monday and taking Ativan intermittently. States constipation and last BM 3 days ago, not sleeping well 5-6 nights, general abdominal pain, and cramping bilateral lower extremities.

## 2021-01-02 NOTE — ED Notes (Signed)
Called pt x1, no answer. Will try again.

## 2021-01-03 MED ORDER — ALUM & MAG HYDROXIDE-SIMETH 200-200-20 MG/5ML PO SUSP
30.0000 mL | Freq: Once | ORAL | Status: AC
  Administered 2021-01-03: 30 mL via ORAL
  Filled 2021-01-03: qty 30

## 2021-01-03 MED ORDER — DICYCLOMINE HCL 20 MG PO TABS: 20.0000 mg | ORAL_TABLET | Freq: Two times a day (BID) | ORAL | 0 refills | Status: AC | PRN

## 2021-01-03 MED ORDER — MAGNESIUM CITRATE PO SOLN: 1.0000 | Freq: Once | ORAL | 0 refills | Status: DC

## 2021-01-03 MED ORDER — MAGNESIUM CITRATE PO SOLN: 1.0000 | Freq: Once | ORAL | 0 refills | Status: AC

## 2021-01-03 MED ORDER — ACETAMINOPHEN 325 MG PO TABS
650.0000 mg | ORAL_TABLET | Freq: Once | ORAL | Status: AC
  Administered 2021-01-03: 650 mg via ORAL
  Filled 2021-01-03: qty 2

## 2021-01-03 MED ORDER — SODIUM CHLORIDE 0.9 % IV BOLUS
1000.0000 mL | Freq: Once | INTRAVENOUS | Status: AC
  Administered 2021-01-03: 1000 mL via INTRAVENOUS

## 2021-01-03 MED ORDER — OMEPRAZOLE 20 MG PO CPDR: 20.0000 mg | DELAYED_RELEASE_CAPSULE | Freq: Every day | ORAL | 0 refills | Status: AC

## 2021-01-03 MED ORDER — OMEPRAZOLE 20 MG PO CPDR: 20.0000 mg | DELAYED_RELEASE_CAPSULE | Freq: Every day | ORAL | 0 refills | Status: DC

## 2021-01-03 MED ORDER — DICYCLOMINE HCL 20 MG PO TABS: 20.0000 mg | ORAL_TABLET | Freq: Two times a day (BID) | ORAL | 0 refills | Status: DC | PRN

## 2021-01-03 NOTE — Discharge Instructions (Addendum)
I prescribed you Bentyl which can take for your abdominal cramping/spasms in the setting of IBS.  I would also like you to take omeprazole 20 mg daily, a proton pump inhibitor, which should help with your burping and stomach discomfort.   I prescribed you magnesium citrate which should help clear your constipation.  Continue to drink plenty of fluids as I suspect that mild dehydration has played a role.  Your laboratory work-up was all reassuring.  I also recommend high-fiber diet.  Please follow-up with your primary care provider regarding today's encounter and for ongoing evaluation of your anxiety, which I also believe has played a role in your collection of symptoms.  Continue take your prescribed Ativan as needed.    I would like for you to reach out to your gastroenterologist, Dr. Benson Norway, for ongoing evaluation and management of your IBS.  Return to the ED or seek immediate medical attention should express any new worsening symptoms.

## 2021-01-03 NOTE — ED Notes (Signed)
Pt ambulated to restroom with steady gait.

## 2021-01-03 NOTE — ED Notes (Signed)
Pt verbalized understanding of d/c paperwork and follow up care. IV removed and bleeding controlled. Pt ambulatory with steady gait to lobby.

## 2021-01-03 NOTE — ED Provider Notes (Signed)
[place] [facility] EMERGENCY DEPARTMENT Provider Note   CSN: [id number] Arrival date & time: 01/02/21  1626     History Chief Complaint  Patient presents with  . Hypertension  . Constipation  . Anxiety  . Abdominal Pain    Shane Reynolds is a 65 y.o. male with PMH of IBS, anxiety, hemorrhoids, and GERD who presents the ED with complaints of constipation and generalized abdominal cramping.  I reviewed patient's medical record and he has been evaluated by his primary care provider at the [facility] twice in the past week for similar symptoms.  His blood pressure was noted to be elevated 230/120 during most recent encounter on 12/31/2020.  He was subsequently started on Hyzaar 50-12.5 mg once daily.  On my examination, patient is anxiously and incessantly talking.  He goes into significant length about his history of IBS.  He states that he has been feeling constipated for the past few days, but had a bowel movement here in the ED while in the waiting room.  Patient had been in the waiting room for approximately 12 hours prior to my examination.  He states that he has been having crampy abdominal discomfort and eructation in addition to cramps involving his legs and hands.  He is concerned for electrolyte derangement.  He states he has not been eating and drinking well.  He also states he has been checking his blood pressures every 30 minutes given his recent hypertension.  Patient had taken proton pump inhibitors in the past, but does not like medications so discontinued.  He was supposed to see his gastroenterologist, Dr. Benson Norway, earlier this week, but was unable to go due to his elevated blood pressures.  He has been taking Metamucil for his constipation symptoms.  Lastly, patient is also endorsing mild headache for "a little while".  Patient also notes that he has some mild right-sided low back discomfort.  He chopped wood for his son a couple weeks ago,  but cannot tell me exactly when his symptoms began.  He states that they have been worsening sitting in the waiting room.  Asking for Tylenol.  He is fully immunized for COVID-19 including booster.   HPI     Past Medical History:  Diagnosis Date  . Acid reflux   . Anemia   . Depression   . Genital herpes   . GERD (gastroesophageal reflux disease)   . Hyperlipidemia   . IBS (irritable bowel syndrome)   . [place] spotted fever     Patient Active Problem List   Diagnosis Date Noted  . Personal history of colonic polyps 08/25/2011  . ANXIETY 09/30/2010  . HEMORRHOIDS, INTERNAL 04/08/2010  . SCIATICA 04/08/2010  . HYPERLIPIDEMIA 03/18/2010  . GERD 03/18/2010  . IRRITABLE BOWEL SYNDROME 03/18/2010    Past Surgical History:  Procedure Laterality Date  . CHOLECYSTECTOMY         Family History  Problem Relation Age of Onset  . Diabetes Mother   . Alcohol abuse Mother   . Stroke Father   . Skin cancer Sister   . Colon cancer Neg Hx     Social History   Tobacco Use  . Smoking status: Former Research scientist (life sciences)  . Smokeless tobacco: Never Used  Substance Use Topics  . Alcohol use: No  . Drug use: No    Home Medications Prior to Admission medications   Medication Sig Start Date End Date Taking? Authorizing Provider  dicyclomine (BENTYL) 20 MG tablet  Take 1 tablet (20 mg total) by mouth 2 (two) times daily as needed for spasms. 01/03/21  Yes Corena Herter, PA-C  magnesium citrate SOLN Take 296 mLs (1 Bottle total) by mouth once for 1 dose. 01/03/21 01/03/21 Yes Corena Herter, PA-C  omeprazole (PRILOSEC) 20 MG capsule Take 1 capsule (20 mg total) by mouth daily. 01/03/21  Yes Corena Herter, PA-C  diphenhydrAMINE (BENADRYL) 25 MG tablet Take 1 tablet (25 mg total) by mouth every 6 (six) hours. 08/14/18   Jola Schmidt, MD  famotidine (PEPCID) 20 MG tablet Take 1 tablet (20 mg total) by mouth 2 (two) times daily. 08/14/18   Jola Schmidt, MD  pseudoephedrine (SUDAFED) 30  MG tablet Take 30 mg by mouth every 4 (four) hours as needed for congestion.    [provider]  ranitidine (ZANTAC) 150 MG tablet Take 150 mg by mouth 2 (two) times daily.    [provider]    Allergies    Codeine  Review of Systems   Review of Systems  All other systems reviewed and are negative.   Physical Exam Updated Vital Signs BP 125/76   Pulse (!) 59   Temp 98 F (36.7 C) (Oral)   Resp 18   Ht 6' 3.5" (1.918 m)   Wt 83.5 kg   SpO2 95%   BMI 22.69 kg/m   Physical Exam Vitals and nursing note reviewed. Exam conducted with a chaperone present.  Constitutional:      General: He is not in acute distress.    Appearance: Normal appearance. He is not ill-appearing.  HENT:     Head: Normocephalic and atraumatic.  Eyes:     General: No scleral icterus.    Conjunctiva/sclera: Conjunctivae normal.  Cardiovascular:     Rate and Rhythm: Normal rate.     Pulses: Normal pulses.  Pulmonary:     Effort: Pulmonary effort is normal. No respiratory distress.     Breath sounds: Normal breath sounds.  Abdominal:     General: Abdomen is flat. There is no distension.     Palpations: Abdomen is soft.     Tenderness: There is no abdominal tenderness. There is no guarding.     Comments: Soft, nondistended.  No focal areas of tenderness.  No guarding.  No overlying skin changes.  Normoactive bowel sounds.  Musculoskeletal:        General: Normal range of motion.     Comments: No midline spinal tenderness to palpation.  No overlying skin changes.  Skin:    General: Skin is dry.  Neurological:     General: No focal deficit present.     Mental Status: He is alert and oriented to person, place, and time.     GCS: GCS eye subscore is 4. GCS verbal subscore is 5. GCS motor subscore is 6.     Cranial Nerves: No cranial nerve deficit.  Psychiatric:        Mood and Affect: Mood normal.        Behavior: Behavior normal.        Thought Content: Thought content normal.      ED Results / Procedures / Treatments   Labs (all labs ordered are listed, but only abnormal results are displayed) Labs Reviewed  COMPREHENSIVE METABOLIC PANEL - Abnormal; Notable for the following components:      Result Value   Glucose, Bld 126 (*)    Calcium 10.4 (*)    Total Bilirubin 1.4 (*)  All other components within normal limits  URINALYSIS, ROUTINE W REFLEX MICROSCOPIC - Abnormal; Notable for the following components:   Color, Urine STRAW (*)    Ketones, ur 5 (*)    All other components within normal limits  LIPASE, BLOOD  CBC    EKG None  Radiology No results found.  Procedures Procedures   Medications Ordered in ED Medications  alum & mag hydroxide-simeth (MAALOX/MYLANTA) 200-200-20 MG/5ML suspension 30 mL (30 mLs Oral Given 01/03/21 2979)  sodium chloride 0.9 % bolus 1,000 mL (1,000 mLs Intravenous New Bag/Given 01/03/21 0639)  acetaminophen (TYLENOL) tablet 650 mg (650 mg Oral Given 01/03/21 8921)    ED Course  I have reviewed the triage vital signs and the nursing notes.  Pertinent labs & imaging results that were available during my care of the patient were reviewed by me and considered in my medical decision making (see chart for details).    MDM Rules/Calculators/A&P                          Shane Reynolds was evaluated in Emergency Department on 01/03/2021 for the symptoms described in the history of present illness. He was evaluated in the context of the global COVID-19 pandemic, which necessitated consideration that the patient might be at risk for infection with the SARS-CoV-2 virus that causes COVID-19. Institutional protocols and algorithms that pertain to the evaluation of patients at risk for COVID-19 are in a state of rapid change based on information released by regulatory bodies including the CDC and federal and state organizations. These policies and algorithms were followed during the patient's care in the ED.  I personally  reviewed patient's medical chart and all notes from triage and staff during today's encounter. I have also ordered and reviewed all labs and imaging that I felt to be medically necessary in the evaluation of this patient's complaints and with consideration of their physical exam. If needed, translation services were available and utilized.   Patient with known history of IBS in the ED for mild constipation, abdominal cramping, and concerned about recently elevated blood pressures.  His blood pressures on my examination are unremarkable, 140s over 100s.  He states that he has not taken one of his blood pressure medications in over 12 hours.  While anxiety is a diagnosis of exclusion, patient is very anxious throughout my entire examination.  His abdominal exam is benign.  Laboratory work-up is personally reviewed and unremarkable.  No electrolyte derangement to explain his muscle cramping.  He denies any nausea or emesis.  For his reported eructation, will provide him with GI cocktail and discharged home with 20 mg omeprazole daily.  We will also prescribe him Bentyl for his abdominal cramping symptoms.  I encouraged him to follow-up with his primary care provider to discuss his anxiety further.  He has been having difficulty sleeping given his anxiety around new hypertension and current symptoms.  He has been excessively checking his BP at home.  While he had been recently prescribed Ativan PRN, he states that he does not take any SSRI or other scheduled antianxiety medications.  I feel as though he would benefit from further discussion with his primary care provider.  He also understands the need to follow-up with his gastroenterologist for ongoing evaluation and management of his IBS symptoms.  Will prescribe him magnesium citrate since he is requesting something that can "flush him out" in light of his recent mild constipation.  We  will give patient 1 L IV NS given his diminished p.o. intake and concern for  dehydration as a contributing factor for his cramping, headache symptoms, and constipation.  We will also provide patient with Maalox Tylenol here in the ED.  At shift change care was transferred to Domenic Moras PA-C who will check on patient after treatments are administered.  I have already completed AVS and am anticipating discharge.  Patient understanding and agreeable to the plan.   Final Clinical Impression(s) / ED Diagnoses Final diagnoses:  Irritable bowel syndrome with constipation    Rx / DC Orders ED Discharge Orders         Ordered    magnesium citrate SOLN   Once        01/03/21 0654    dicyclomine (BENTYL) 20 MG tablet  2 times daily PRN        01/03/21 0654    omeprazole (PRILOSEC) 20 MG capsule  Daily        01/03/21 0654           Corena Herter, PA-C 01/03/21 1610    Ripley Fraise, MD 01/04/21 [phone]

## 2021-12-10 ENCOUNTER — Other Ambulatory Visit: Payer: Self-pay | Admitting: Family Medicine

## 2021-12-10 DIAGNOSIS — F419 Anxiety disorder, unspecified: Secondary | ICD-10-CM

## 2021-12-10 DIAGNOSIS — R634 Abnormal weight loss: Secondary | ICD-10-CM

## 2021-12-11 ENCOUNTER — Other Ambulatory Visit: Payer: Self-pay | Admitting: Family Medicine

## 2021-12-11 DIAGNOSIS — F419 Anxiety disorder, unspecified: Secondary | ICD-10-CM

## 2021-12-11 DIAGNOSIS — R9389 Abnormal findings on diagnostic imaging of other specified body structures: Secondary | ICD-10-CM

## 2021-12-11 DIAGNOSIS — R634 Abnormal weight loss: Secondary | ICD-10-CM

## 2021-12-30 ENCOUNTER — Ambulatory Visit
Admission: RE | Admit: 2021-12-30 | Discharge: 2021-12-30 | Disposition: A | Discharge status: Home / Self Care | Payer: Self-pay | Source: Ambulatory Visit | Attending: Family Medicine | Admitting: Family Medicine

## 2021-12-30 ENCOUNTER — Other Ambulatory Visit: Payer: Self-pay

## 2021-12-30 DIAGNOSIS — R634 Abnormal weight loss: Secondary | ICD-10-CM | POA: Insufficient documentation

## 2021-12-30 DIAGNOSIS — R9389 Abnormal findings on diagnostic imaging of other specified body structures: Secondary | ICD-10-CM | POA: Insufficient documentation

## 2021-12-30 DIAGNOSIS — F419 Anxiety disorder, unspecified: Secondary | ICD-10-CM | POA: Insufficient documentation

## 2021-12-30 MED ORDER — IOHEXOL 300 MG/ML  SOLN
100.0000 mL | Freq: Once | INTRAMUSCULAR | Status: AC | PRN
  Administered 2021-12-30: 100 mL via INTRAVENOUS

## 2023-11-03 ENCOUNTER — Encounter: Payer: Self-pay | Admitting: Internal Medicine

## 2023-11-04 ENCOUNTER — Encounter: Payer: Self-pay | Admitting: Internal Medicine

## 2023-11-04 ENCOUNTER — Encounter
Admission: RE | Disposition: A | Discharge status: Home / Self Care | Payer: Self-pay | Source: Home / Self Care | Attending: Internal Medicine

## 2023-11-04 ENCOUNTER — Ambulatory Visit: Payer: Self-pay | Admitting: General Practice

## 2023-11-04 ENCOUNTER — Other Ambulatory Visit: Payer: Self-pay

## 2023-11-04 ENCOUNTER — Ambulatory Visit
Admission: RE | Admit: 2023-11-04 | Discharge: 2023-11-04 | Disposition: A | Discharge status: Home / Self Care | Payer: Self-pay | Attending: Internal Medicine | Admitting: Internal Medicine

## 2023-11-04 DIAGNOSIS — D122 Benign neoplasm of ascending colon: Secondary | ICD-10-CM | POA: Insufficient documentation

## 2023-11-04 DIAGNOSIS — K5730 Diverticulosis of large intestine without perforation or abscess without bleeding: Secondary | ICD-10-CM | POA: Insufficient documentation

## 2023-11-04 DIAGNOSIS — Z860101 Personal history of adenomatous and serrated colon polyps: Secondary | ICD-10-CM | POA: Insufficient documentation

## 2023-11-04 DIAGNOSIS — K640 First degree hemorrhoids: Secondary | ICD-10-CM | POA: Insufficient documentation

## 2023-11-04 DIAGNOSIS — Z1211 Encounter for screening for malignant neoplasm of colon: Secondary | ICD-10-CM | POA: Insufficient documentation

## 2023-11-04 HISTORY — DX: Essential (primary) hypertension: I10

## 2023-11-04 HISTORY — PX: COLONOSCOPY WITH PROPOFOL: SHX5780

## 2023-11-04 SURGERY — COLONOSCOPY WITH PROPOFOL
Anesthesia: General

## 2023-11-04 MED ORDER — LIDOCAINE HCL (CARDIAC) PF 100 MG/5ML IV SOSY
PREFILLED_SYRINGE | INTRAVENOUS | Status: DC | PRN
  Administered 2023-11-04: 60 mg via INTRAVENOUS

## 2023-11-04 MED ORDER — SODIUM CHLORIDE 0.9 % IV SOLN: INTRAVENOUS | Status: DC

## 2023-11-04 MED ORDER — LIDOCAINE HCL (PF) 2 % IJ SOLN
INTRAMUSCULAR | Status: AC
  Filled 2023-11-04: qty 5

## 2023-11-04 MED ORDER — PROPOFOL 10 MG/ML IV BOLUS
INTRAVENOUS | Status: DC | PRN
  Administered 2023-11-04: 50 mg via INTRAVENOUS

## 2023-11-04 MED ORDER — GLYCOPYRROLATE 0.2 MG/ML IJ SOLN
INTRAMUSCULAR | Status: AC
  Filled 2023-11-04: qty 1

## 2023-11-04 MED ORDER — PROPOFOL 1000 MG/100ML IV EMUL
INTRAVENOUS | Status: AC
  Filled 2023-11-04: qty 100

## 2023-11-04 MED ORDER — GLYCOPYRROLATE 0.2 MG/ML IJ SOLN
INTRAMUSCULAR | Status: DC | PRN
  Administered 2023-11-04: .2 mg via INTRAVENOUS

## 2023-11-04 MED ORDER — DEXMEDETOMIDINE HCL IN NACL 80 MCG/20ML IV SOLN
INTRAVENOUS | Status: DC | PRN
  Administered 2023-11-04: 12 ug via INTRAVENOUS
  Administered 2023-11-04: 8 ug via INTRAVENOUS

## 2023-11-04 MED ORDER — PROPOFOL 500 MG/50ML IV EMUL
INTRAVENOUS | Status: DC | PRN
  Administered 2023-11-04: 75 ug/kg/min via INTRAVENOUS

## 2023-11-04 NOTE — Transfer of Care (Signed)
Immediate Anesthesia Transfer of Care Note  Patient: Shane Reynolds  Procedure(s) Performed: COLONOSCOPY WITH PROPOFOL  Patient Location: PACU  Anesthesia Type:General  Level of Consciousness: sedated  Airway & Oxygen Therapy: Patient Spontanous Breathing  Post-op Assessment: Report given to RN and Post -op Vital signs reviewed and stable  Post vital signs: Reviewed and stable  Last Vitals:  Vitals Value Taken Time  BP    Temp    Pulse 41 11/04/23 0956  Resp 16 11/04/23 0956  SpO2 97 % 11/04/23 0956  Vitals shown include unfiled device data.  Last Pain:  Vitals:   11/04/23 0832  TempSrc: Temporal         Complications: No notable events documented.

## 2023-11-04 NOTE — Anesthesia Postprocedure Evaluation (Signed)
Anesthesia Post Note  Patient: Shane Reynolds  Procedure(s) Performed: COLONOSCOPY WITH PROPOFOL  Patient location during evaluation: PACU Anesthesia Type: General Level of consciousness: awake and alert Pain management: pain level controlled Vital Signs Assessment: post-procedure vital signs reviewed and stable Respiratory status: spontaneous breathing, nonlabored ventilation, respiratory function stable and patient connected to nasal cannula oxygen Cardiovascular status: blood pressure returned to baseline and stable Postop Assessment: no apparent nausea or vomiting Anesthetic complications: no  No notable events documented.   Last Vitals:  Vitals:   11/04/23 1008 11/04/23 1015  BP: 108/63 117/88  Pulse: (!) 40 (!) 44  Resp: (!) 23 20  Temp:    SpO2: 90% 97%    Last Pain:  Vitals:   11/04/23 1015  TempSrc:   PainSc: 0-No pain                 Stephanie Coup

## 2023-11-04 NOTE — Op Note (Signed)
[facility] Gastroenterology Patient Name: Shane Reynolds Procedure Date: 11/04/2023 9:16 AM MRN: [id number] Account #: [id number] Date of Birth: April 27, 1956 Admit Type: Outpatient Age: 67 Room: [place] [facility] ROOM 2 Gender: Male Note Status: Finalized Instrument Name: Prentice Docker [id number] Procedure:             Colonoscopy Indications:           High risk colon cancer surveillance: Personal history                         of non-advanced adenoma, High risk colon cancer                         surveillance: Personal history of adenoma less than 10                         mm in size Providers:             Almina Schul K. Demorris Choyce MD, MD Medicines:             Propofol per Anesthesia Complications:         No immediate complications. Estimated blood loss: None. Procedure:             Pre-Anesthesia Assessment:                        - The risks and benefits of the procedure and the                         sedation options and risks were discussed with the                         patient. All questions were answered and informed                         consent was obtained.                        - Patient identification and proposed procedure were                         verified prior to the procedure by the nurse. The                         procedure was verified in the procedure room.                        - ASA Grade Assessment: II - A patient with mild                         systemic disease.                        - After reviewing the risks and benefits, the patient                         was deemed in satisfactory condition to undergo the                         procedure.  After obtaining informed consent, the colonoscope was                         passed under direct vision. Throughout the procedure,                         the patient's blood pressure, pulse, and oxygen                         saturations were monitored continuously. The                          Colonoscope was introduced through the anus and                         advanced to the the cecum, identified by appendiceal                         orifice and ileocecal valve. The colonoscopy was                         performed without difficulty. The patient tolerated                         the procedure well. The quality of the bowel                         preparation was good. The ileocecal valve, appendiceal                         orifice, and rectum were photographed. Findings:      The perianal and digital rectal examinations were normal. Pertinent       negatives include normal sphincter tone and no palpable rectal lesions.      A few small-mouthed diverticula were found in the sigmoid colon.      Non-bleeding internal hemorrhoids were found during retroflexion. The       hemorrhoids were Grade I (internal hemorrhoids that do not prolapse).      The exam was otherwise without abnormality. Impression:            - Diverticulosis in the sigmoid colon.                        - Non-bleeding internal hemorrhoids.                        - The examination was otherwise normal.                        - No specimens collected. Recommendation:        - Patient has a contact number available for                         emergencies. The signs and symptoms of potential                         delayed complications were discussed with the patient.  Return to normal activities tomorrow. Written                         discharge instructions were provided to the patient.                        - Resume previous diet.                        - Continue present medications.                        - Repeat colonoscopy in 10 years for screening                         purposes.                        - Return to GI office PRN.                        - The findings and recommendations were discussed with                         the  patient. Procedure Code(s):     --- Professional ---                        Z6109, Colorectal cancer screening; colonoscopy on                         individual at high risk Diagnosis Code(s):     --- Professional ---                        K57.30, Diverticulosis of large intestine without                         perforation or abscess without bleeding                        Z86.010, Personal history of colonic polyps                        K64.0, First degree hemorrhoids CPT copyright 2022 American Medical Association. All rights reserved. The codes documented in this report are preliminary and upon coder review may  be revised to meet current compliance requirements. Stanton Kidney MD, MD 11/04/2023 9:57:31 AM This report has been signed electronically. Number of Addenda: 0 Note Initiated On: 11/04/2023 9:16 AM Scope Withdrawal Time: 0 hours 7 minutes 26 seconds  Total Procedure Duration: 0 hours 11 minutes 44 seconds  Estimated Blood Loss:  Estimated blood loss: none. Estimated blood loss: none.      [facility]

## 2023-11-04 NOTE — Anesthesia Preprocedure Evaluation (Signed)
Anesthesia Evaluation  Patient identified by MRN, date of birth, ID band Patient awake    Reviewed: Allergy & Precautions, NPO status , Patient's Chart, lab work & pertinent test results  Airway Mallampati: III  TM Distance: >3 FB Neck ROM: full    Dental  (+) Chipped, Dental Advidsory Given   Pulmonary neg pulmonary ROS, former smoker   Pulmonary exam normal        Cardiovascular hypertension, negative cardio ROS Normal cardiovascular exam     Neuro/Psych  PSYCHIATRIC DISORDERS Anxiety Depression    negative neurological ROS     GI/Hepatic Neg liver ROS,GERD  Medicated and Controlled,,  Endo/Other  negative endocrine ROS    Renal/GU negative Renal ROS  negative genitourinary   Musculoskeletal   Abdominal   Peds  Hematology negative hematology ROS (+)   Anesthesia Other Findings Past Medical History: No date: Acid reflux No date: Anemia No date: Depression No date: Genital herpes No date: GERD (gastroesophageal reflux disease) No date: Hyperlipidemia No date: Hypertension No date: IBS (irritable bowel syndrome) No date: [facility] spotted fever  Past Surgical History: No date: CHOLECYSTECTOMY  BMI    Body Mass Index: 20.91 kg/m      Reproductive/Obstetrics negative OB ROS                             Anesthesia Physical Anesthesia Plan  ASA: 2  Anesthesia Plan: General   Post-op Pain Management: Minimal or no pain anticipated   Induction: Intravenous  PONV Risk Score and Plan: 3 and Propofol infusion, TIVA and Ondansetron  Airway Management Planned: Nasal Cannula  Additional Equipment: None  Intra-op Plan:   Post-operative Plan:   Informed Consent: I have reviewed the patients History and Physical, chart, labs and discussed the procedure including the risks, benefits and alternatives for the proposed anesthesia with the patient or authorized representative  who has indicated his/her understanding and acceptance.     Dental advisory given  Plan Discussed with: CRNA and Surgeon  Anesthesia Plan Comments: (Discussed risks of anesthesia with patient, including possibility of difficulty with spontaneous ventilation under anesthesia necessitating airway intervention, PONV, and rare risks such as cardiac or respiratory or neurological events, and allergic reactions. Discussed the role of CRNA in patient's perioperative care. Patient understands.)       Anesthesia Quick Evaluation

## 2023-11-04 NOTE — Interval H&P Note (Signed)
History and Physical Interval Note:  11/04/2023 9:25 AM  Shane Reynolds  has presented today for surgery, with the diagnosis of Z86.010 - Personal history of colonic polyps.  The various methods of treatment have been discussed with the patient and family. After consideration of risks, benefits and other options for treatment, the patient has consented to  Procedure(s): COLONOSCOPY WITH PROPOFOL (N/A) as a surgical intervention.  The patient's history has been reviewed, patient examined, no change in status, stable for surgery.  I have reviewed the patient's chart and labs.  Questions were answered to the patient's satisfaction.     [place], [place]

## 2023-11-04 NOTE — H&P (Signed)
Outpatient short stay form Pre-procedure 11/04/2023 9:24 AM Shane Reynolds, M.D.  Primary Physician: Debbra Riding, PA-C  Reason for visit:  Colonoscopy 2019---27mm polyp ascending, path w/ tubular adenoma histology .   History of present illness:                            Patient presents for colonoscopy for a personal hx of colon polyps. The patient denies abdominal pain, abnormal weight loss or rectal bleeding.      Current Facility-Administered Medications:    0.9 %  sodium chloride infusion, , Intravenous, Continuous, [place], Boykin Nearing, MD, Last Rate: 20 mL/hr at 11/04/23 6213, New Bag at 11/04/23 0865  Medications Prior to Admission  Medication Sig Dispense Refill Last Dose/Taking   acetaminophen (TYLENOL) 325 MG tablet Take 650 mg by mouth every 6 (six) hours as needed for mild pain (pain score 1-3).   11/03/2023   dicyclomine (BENTYL) 20 MG tablet Take 1 tablet (20 mg total) by mouth 2 (two) times daily as needed for spasms. 20 tablet 0 Past Week   losartan (COZAAR) 25 MG tablet Take 25 mg by mouth daily.   Past Week   pravastatin (PRAVACHOL) 10 MG tablet Take 10 mg by mouth daily.   Past Week   diphenhydrAMINE (BENADRYL) 25 MG tablet Take 1 tablet (25 mg total) by mouth every 6 (six) hours. (Patient not taking: Reported on 11/04/2023) 12 tablet 0 Not Taking   famotidine (PEPCID) 20 MG tablet Take 1 tablet (20 mg total) by mouth 2 (two) times daily. (Patient not taking: Reported on 11/04/2023) 10 tablet 0 Not Taking   omeprazole (PRILOSEC) 20 MG capsule Take 1 capsule (20 mg total) by mouth daily. (Patient not taking: Reported on 11/04/2023) 30 capsule 0 Not Taking   pseudoephedrine (SUDAFED) 30 MG tablet Take 30 mg by mouth every 4 (four) hours as needed for congestion. (Patient not taking: Reported on 11/04/2023)   Not Taking   ranitidine (ZANTAC) 150 MG tablet Take 150 mg by mouth 2 (two) times daily. (Patient not taking: Reported on 11/04/2023)   Not Taking      Allergies  Allergen Reactions   Codeine Nausea And Vomiting   Sulfa Antibiotics Rash     Past Medical History:  Diagnosis Date   Acid reflux    Anemia    Depression    Genital herpes    GERD (gastroesophageal reflux disease)    Hyperlipidemia    Hypertension    IBS (irritable bowel syndrome)    [facility] spotted fever     Review of systems:  Otherwise negative.    Physical Exam  Gen: Alert, oriented. Appears stated age.  HEENT: [place]/AT. PERRLA. Lungs: CTA, no wheezes. CV: RR nl S1, S2. Abd: soft, benign, no masses. BS+ Ext: No edema. Pulses 2+    Planned procedures: Proceed with colonoscopy. The patient understands the nature of the planned procedure, indications, risks, alternatives and potential complications including but not limited to bleeding, infection, perforation, damage to internal organs and possible oversedation/side effects from anesthesia. The patient agrees and gives consent to proceed.  Please refer to procedure notes for findings, recommendations and patient disposition/instructions.     Shane Reynolds, M.D. Gastroenterology 11/04/2023  9:24 AM

## 2023-11-05 ENCOUNTER — Encounter: Payer: Self-pay | Admitting: Internal Medicine

## 2024-01-28 ENCOUNTER — Other Ambulatory Visit: Payer: Self-pay | Admitting: Family Medicine

## 2024-01-28 DIAGNOSIS — Z8249 Family history of ischemic heart disease and other diseases of the circulatory system: Secondary | ICD-10-CM

## 2024-01-28 DIAGNOSIS — Z0000 Encounter for general adult medical examination without abnormal findings: Secondary | ICD-10-CM

## 2024-01-29 ENCOUNTER — Other Ambulatory Visit: Payer: Self-pay

## 2024-02-03 ENCOUNTER — Ambulatory Visit
Admission: RE | Admit: 2024-02-03 | Discharge: 2024-02-03 | Disposition: A | Discharge status: Home / Self Care | Payer: Self-pay | Source: Ambulatory Visit | Attending: Family Medicine | Admitting: Family Medicine

## 2024-02-03 DIAGNOSIS — Z8249 Family history of ischemic heart disease and other diseases of the circulatory system: Secondary | ICD-10-CM | POA: Insufficient documentation

## 2024-02-03 DIAGNOSIS — Z0000 Encounter for general adult medical examination without abnormal findings: Secondary | ICD-10-CM | POA: Insufficient documentation

## 2024-02-11 ENCOUNTER — Other Ambulatory Visit: Payer: Self-pay | Admitting: Nurse Practitioner

## 2024-02-11 DIAGNOSIS — I700 Atherosclerosis of aorta: Secondary | ICD-10-CM

## 2024-02-11 DIAGNOSIS — I10 Essential (primary) hypertension: Secondary | ICD-10-CM

## 2024-02-11 DIAGNOSIS — R931 Abnormal findings on diagnostic imaging of heart and coronary circulation: Secondary | ICD-10-CM

## 2024-02-11 DIAGNOSIS — E782 Mixed hyperlipidemia: Secondary | ICD-10-CM

## 2024-02-11 DIAGNOSIS — I2510 Atherosclerotic heart disease of native coronary artery without angina pectoris: Secondary | ICD-10-CM

## 2024-02-12 ENCOUNTER — Telehealth (HOSPITAL_COMMUNITY): Payer: Self-pay | Admitting: *Deleted

## 2024-02-12 NOTE — Telephone Encounter (Signed)
Attempted to call patient regarding upcoming cardiac CT appointment. Voicemail box full.  Larey Brick RN Navigator Cardiac Imaging [facility] Heart and Vascular Services [phone] Office [phone] Cell

## 2024-02-15 ENCOUNTER — Ambulatory Visit
Admission: RE | Admit: 2024-02-15 | Discharge: 2024-02-15 | Disposition: A | Discharge status: Home / Self Care | Payer: Self-pay | Source: Ambulatory Visit | Attending: Nurse Practitioner | Admitting: Nurse Practitioner

## 2024-02-15 DIAGNOSIS — E782 Mixed hyperlipidemia: Secondary | ICD-10-CM | POA: Insufficient documentation

## 2024-02-15 DIAGNOSIS — I2510 Atherosclerotic heart disease of native coronary artery without angina pectoris: Secondary | ICD-10-CM | POA: Insufficient documentation

## 2024-02-15 DIAGNOSIS — I10 Essential (primary) hypertension: Secondary | ICD-10-CM | POA: Insufficient documentation

## 2024-02-15 DIAGNOSIS — R931 Abnormal findings on diagnostic imaging of heart and coronary circulation: Secondary | ICD-10-CM | POA: Insufficient documentation

## 2024-02-15 DIAGNOSIS — I700 Atherosclerosis of aorta: Secondary | ICD-10-CM | POA: Insufficient documentation

## 2024-02-15 MED ORDER — NITROGLYCERIN 0.4 MG SL SUBL
SUBLINGUAL_TABLET | SUBLINGUAL | Status: AC
  Filled 2024-02-15: qty 2

## 2024-02-15 MED ORDER — IOHEXOL 300 MG/ML  SOLN: 100.0000 mL | Freq: Once | INTRAMUSCULAR | Status: DC | PRN

## 2024-02-15 MED ORDER — DILTIAZEM HCL 25 MG/5ML IV SOLN
10.0000 mg | INTRAVENOUS | Status: DC | PRN
  Filled 2024-02-15: qty 5

## 2024-02-15 MED ORDER — IOHEXOL 350 MG/ML SOLN
80.0000 mL | Freq: Once | INTRAVENOUS | Status: AC | PRN
  Administered 2024-02-15: 80 mL via INTRAVENOUS

## 2024-02-15 MED ORDER — NITROGLYCERIN 0.4 MG SL SUBL
0.8000 mg | SUBLINGUAL_TABLET | Freq: Once | SUBLINGUAL | Status: AC
  Administered 2024-02-15: 0.8 mg via SUBLINGUAL
  Filled 2024-02-15: qty 25

## 2024-02-15 MED ORDER — METOPROLOL TARTRATE 5 MG/5ML IV SOLN
10.0000 mg | INTRAVENOUS | Status: DC | PRN
  Filled 2024-02-15: qty 10

## 2024-03-09 ENCOUNTER — Other Ambulatory Visit: Payer: Self-pay | Admitting: Family Medicine

## 2024-03-09 DIAGNOSIS — M898X5 Other specified disorders of bone, thigh: Secondary | ICD-10-CM

## 2024-03-10 ENCOUNTER — Ambulatory Visit
Admission: RE | Admit: 2024-03-10 | Discharge: 2024-03-10 | Disposition: A | Discharge status: Home / Self Care | Payer: Self-pay | Source: Ambulatory Visit | Attending: Family Medicine | Admitting: Family Medicine

## 2024-03-10 DIAGNOSIS — M898X5 Other specified disorders of bone, thigh: Secondary | ICD-10-CM

## 2024-10-29 ENCOUNTER — Observation Stay: Admission: EM | Admit: 2024-10-29 | Discharge: 2024-11-01 | DRG: 607 | Disposition: A | Payer: Self-pay

## 2024-10-29 ENCOUNTER — Emergency Department: Payer: Self-pay

## 2024-10-29 ENCOUNTER — Other Ambulatory Visit: Payer: Self-pay

## 2024-10-29 ENCOUNTER — Encounter: Payer: Self-pay | Admitting: Emergency Medicine

## 2024-10-29 DIAGNOSIS — R1909 Other intra-abdominal and pelvic swelling, mass and lump: Principal | ICD-10-CM

## 2024-10-29 DIAGNOSIS — R21 Rash and other nonspecific skin eruption: Secondary | ICD-10-CM

## 2024-10-29 MED ORDER — METHYLPREDNISOLONE SODIUM SUCC 40 MG IJ SOLR
40.0000 mg | Freq: Once | INTRAMUSCULAR | Status: AC
  Administered 2024-10-30: 40 mg via INTRAVENOUS
  Filled 2024-10-29: qty 1

## 2024-10-29 NOTE — ED Provider Notes (Signed)
 [facility] Provider Note    Event Date/Time   First MD Initiated Contact with Patient 10/29/24 2219     (approximate)   History   Allergic Reaction and Groin Swelling   HPI  Shane Reynolds is a 68 y.o. male presenting to the emergency department complaining of a rash and swelling to his groin.  The patient reports that he started taking doxycycline  yesterday for a boil to his left armpit.  He reports that afterwards he noted redness to his hands as well as welts to his buttocks and legs.  He reports that he also has a purpleish discoloration to his groin area as well as swelling to his scrotum.  He reports that he took Benadryl  last night due to itching and has taken Benadryl  today as well but is no longer having any itching.  He denies any shortness of breath, lip swelling, tongue swelling, or throat swelling.     Physical Exam   Triage Vital Signs: ED Triage Vitals  Encounter Vitals Group     BP 10/29/24 2206 (!) 119/96     Girls Systolic BP Percentile --      Girls Diastolic BP Percentile --      Boys Systolic BP Percentile --      Boys Diastolic BP Percentile --      Pulse Rate 10/29/24 2206 69     Resp 10/29/24 2206 17     Temp 10/29/24 2206 98.9 F (37.2 C)     Temp Source 10/29/24 2206 Oral     SpO2 10/29/24 2206 96 %     Weight 10/29/24 2210 180 lb (81.6 kg)     Height 10/29/24 2210 6' 3.5 (1.918 m)     Head Circumference --      Peak Flow --      Pain Score 10/29/24 2209 0     Pain Loc --      Pain Education --      Exclude from Growth Chart --     Most recent vital signs: Vitals:   10/29/24 2206  BP: (!) 119/96  Pulse: 69  Resp: 17  Temp: 98.9 F (37.2 C)  SpO2: 96%     General: Awake, no distress.  CV:  Good peripheral perfusion.  Resp:  Normal effort.  Abd:  No distention.  Other:  Swelling noted to just below the glans penis, ecchymosis noted to bilateral groin, swelling to the scrotum bilaterally; erythematous  patches scattered across hands and legs   ED Results / Procedures / Treatments   Labs (all labs ordered are listed, but only abnormal results are displayed) Labs Reviewed  CBC  COMPREHENSIVE METABOLIC PANEL WITH GFR  LACTIC ACID, PLASMA  LACTIC ACID, PLASMA  URINALYSIS, W/ REFLEX TO CULTURE (INFECTION SUSPECTED)     EKG     RADIOLOGY Scrotal ultrasound was performed which showed small bilateral hydroceles.    PROCEDURES:  Critical Care performed: No  Procedures   MEDICATIONS ORDERED IN ED: Medications  methylPREDNISolone  sodium succinate (SOLU-MEDROL ) 40 mg/mL injection 40 mg (has no administration in time range)     IMPRESSION / MDM / ASSESSMENT AND PLAN / ED COURSE  I reviewed the triage vital signs and the nursing notes.                               Patient's presentation is most consistent with acute complicated illness / injury requiring diagnostic  workup.  This patient is a 68 year old male presenting to the emergency department complaining of a possible allergic reaction.  Discussed with the patient that I would treat him with steroids but I was concerned that his symptoms were not related to an allergic reaction and that I would like to do further workup including ultrasound and lab work.  The patient is agreeable.  CT added to the workup.  Patient's care was signed out to the night shift provider at shift change pending completion of workup      FINAL CLINICAL IMPRESSION(S) / ED DIAGNOSES   Final diagnoses:  Groin swelling  Rash     Rx / DC Orders   ED Discharge Orders     None        Note:  This document was prepared using Dragon voice recognition software and may include unintentional dictation errors.   Waneta Fitting M, MD 10/30/24 [phone]

## 2024-10-29 NOTE — ED Triage Notes (Signed)
 To ER with report of possible reaction to doxycycline  that he started at 230p on 10/28/24 for boil to left axilla. Reports redness to hands, purple to groin, welts to buttocks and swelling to scrotum area that just started. Started taking benadryl  last night due to itching.  Denies any shortness of breath or airway discomfort. Reports feeling anxious.

## 2024-10-30 ENCOUNTER — Emergency Department: Payer: Self-pay

## 2024-10-30 DIAGNOSIS — L270 Generalized skin eruption due to drugs and medicaments taken internally: Secondary | ICD-10-CM | POA: Insufficient documentation

## 2024-10-30 DIAGNOSIS — L02419 Cutaneous abscess of limb, unspecified: Secondary | ICD-10-CM | POA: Diagnosis present

## 2024-10-30 LAB — COMPREHENSIVE METABOLIC PANEL WITH GFR
ALT: 24 U/L (ref 0–44)
AST: 24 U/L (ref 15–41)
Albumin: 4.3 g/dL (ref 3.5–5.0)
Alkaline Phosphatase: 55 U/L (ref 38–126)
Anion gap: 14 (ref 5–15)
BUN: 22 mg/dL (ref 8–23)
CO2: 22 mmol/L (ref 22–32)
Calcium: 9.8 mg/dL (ref 8.9–10.3)
Chloride: 102 mmol/L (ref 98–111)
Creatinine, Ser: 0.92 mg/dL (ref 0.61–1.24)
GFR, Estimated: >60 mL/min (ref >60)
Glucose, Bld: 95 mg/dL (ref 70–99)
Potassium: 3.9 mmol/L (ref 3.5–5.1)
Sodium: 138 mmol/L (ref 135–145)
Total Bilirubin: 0.8 mg/dL (ref 0.0–1.2)
Total Protein: 7.5 g/dL (ref 6.5–8.1)

## 2024-10-30 LAB — CBC WITH DIFFERENTIAL/PLATELET
Abs Immature Granulocytes: 0.02 K/uL (ref 0.00–0.07)
Basophils Absolute: 0 K/uL (ref 0.0–0.1)
Basophils Relative: 0 %
Eosinophils Absolute: 0 K/uL (ref 0.0–0.5)
Eosinophils Relative: 0 %
HCT: 42.2 % (ref 39.0–52.0)
Hemoglobin: 14.5 g/dL (ref 13.0–17.0)
Immature Granulocytes: 0 %
Lymphocytes Relative: 11 %
Lymphs Abs: 0.9 K/uL (ref 0.7–4.0)
MCH: 33.6 pg (ref 26.0–34.0)
MCHC: 34.4 g/dL (ref 30.0–36.0)
MCV: 97.9 fL (ref 80.0–100.0)
Monocytes Absolute: 0.2 K/uL (ref 0.1–1.0)
Monocytes Relative: 2 %
Neutro Abs: 7.1 K/uL (ref 1.7–7.7)
Neutrophils Relative %: 87 %
Platelets: 222 K/uL (ref 150–400)
RBC: 4.31 MIL/uL (ref 4.22–5.81)
RDW: 11.9 % (ref 11.5–15.5)
WBC: 8.1 K/uL (ref 4.0–10.5)
nRBC: 0 % (ref 0.0–0.2)

## 2024-10-30 LAB — CBC
HCT: 39.2 % (ref 39.0–52.0)
Hemoglobin: 13.3 g/dL (ref 13.0–17.0)
MCH: 33.3 pg (ref 26.0–34.0)
MCHC: 33.9 g/dL (ref 30.0–36.0)
MCV: 98.2 fL (ref 80.0–100.0)
Platelets: 175 K/uL (ref 150–400)
RBC: 3.99 MIL/uL — ABNORMAL LOW (ref 4.22–5.81)
RDW: 11.9 % (ref 11.5–15.5)
WBC: 5.5 K/uL (ref 4.0–10.5)
nRBC: 0 % (ref 0.0–0.2)

## 2024-10-30 LAB — URINALYSIS, W/ REFLEX TO CULTURE (INFECTION SUSPECTED)
Bacteria, UA: NONE SEEN
Bilirubin Urine: NEGATIVE
Glucose, UA: NEGATIVE mg/dL
Hgb urine dipstick: NEGATIVE
Ketones, ur: NEGATIVE mg/dL
Leukocytes,Ua: NEGATIVE
Nitrite: NEGATIVE
Protein, ur: NEGATIVE mg/dL
RBC / HPF: 0 RBC/hpf (ref 0–5)
Specific Gravity, Urine: 1.005 (ref 1.005–1.030)
Squamous Epithelial / HPF: 0 /HPF (ref 0–5)
WBC, UA: 0 WBC/hpf (ref 0–5)
pH: 5 (ref 5.0–8.0)

## 2024-10-30 LAB — LACTIC ACID, PLASMA: Lactic Acid, Venous: 1 mmol/L (ref 0.5–1.9)

## 2024-10-30 LAB — MRSA NEXT GEN BY PCR, NASAL: MRSA by PCR Next Gen: DETECTED — AB

## 2024-10-30 LAB — HIV ANTIBODY (ROUTINE TESTING W REFLEX): HIV Screen 4th Generation wRfx: NONREACTIVE

## 2024-10-30 MED ORDER — LOSARTAN POTASSIUM-HCTZ 50-12.5 MG PO TABS: 1.0000 | ORAL_TABLET | Freq: Every day | ORAL | Status: DC

## 2024-10-30 MED ORDER — LINEZOLID 600 MG PO TABS
600.0000 mg | ORAL_TABLET | Freq: Two times a day (BID) | ORAL | Status: DC
  Filled 2024-10-30 (×2): qty 1

## 2024-10-30 MED ORDER — VANCOMYCIN HCL 2000 MG/400ML IV SOLN
2000.0000 mg | Freq: Once | INTRAVENOUS | Status: AC
  Administered 2024-10-30: 2000 mg via INTRAVENOUS
  Filled 2024-10-30: qty 400

## 2024-10-30 MED ORDER — HYOSCYAMINE SULFATE SL 0.125 MG SL SUBL: 0.1250 mg | SUBLINGUAL_TABLET | Freq: Four times a day (QID) | SUBLINGUAL | Status: DC | PRN

## 2024-10-30 MED ORDER — DICYCLOMINE HCL 20 MG PO TABS: 20.0000 mg | ORAL_TABLET | Freq: Two times a day (BID) | ORAL | Status: DC | PRN

## 2024-10-30 MED ORDER — DIPHENHYDRAMINE HCL 50 MG/ML IJ SOLN
12.5000 mg | Freq: Once | INTRAMUSCULAR | Status: AC
  Administered 2024-10-30: 12.5 mg via INTRAVENOUS
  Filled 2024-10-30: qty 1

## 2024-10-30 MED ORDER — LOSARTAN POTASSIUM 50 MG PO TABS
50.0000 mg | ORAL_TABLET | Freq: Every day | ORAL | Status: DC
  Administered 2024-10-30 – 2024-11-01 (×3): 50 mg via ORAL
  Filled 2024-10-30 (×3): qty 1

## 2024-10-30 MED ORDER — IOHEXOL 300 MG/ML  SOLN
100.0000 mL | Freq: Once | INTRAMUSCULAR | Status: AC | PRN
  Administered 2024-10-30: 100 mL via INTRAVENOUS

## 2024-10-30 MED ORDER — TRIAMCINOLONE ACETONIDE 0.5 % EX OINT
TOPICAL_OINTMENT | Freq: Four times a day (QID) | CUTANEOUS | Status: DC
  Filled 2024-10-30 (×2): qty 15

## 2024-10-30 MED ORDER — ACETAMINOPHEN 325 MG PO TABS
650.0000 mg | ORAL_TABLET | Freq: Four times a day (QID) | ORAL | Status: DC | PRN
  Administered 2024-10-31 – 2024-11-01 (×2): 650 mg via ORAL
  Filled 2024-10-30 (×2): qty 2

## 2024-10-30 MED ORDER — ONDANSETRON HCL 4 MG PO TABS: 4.0000 mg | ORAL_TABLET | Freq: Four times a day (QID) | ORAL | Status: DC | PRN

## 2024-10-30 MED ORDER — ASPIRIN 81 MG PO TBEC
81.0000 mg | DELAYED_RELEASE_TABLET | Freq: Every day | ORAL | Status: DC
  Administered 2024-10-30 – 2024-11-01 (×3): 81 mg via ORAL
  Filled 2024-10-30 (×3): qty 1

## 2024-10-30 MED ORDER — DIPHENHYDRAMINE HCL 25 MG PO CAPS
25.0000 mg | ORAL_CAPSULE | Freq: Four times a day (QID) | ORAL | Status: DC
  Administered 2024-10-30 – 2024-11-01 (×9): 25 mg via ORAL
  Filled 2024-10-30 (×9): qty 1

## 2024-10-30 MED ORDER — DIPHENHYDRAMINE HCL 25 MG PO CAPS: 25.0000 mg | ORAL_CAPSULE | ORAL | Status: DC | PRN

## 2024-10-30 MED ORDER — ACETAMINOPHEN 650 MG RE SUPP: 650.0000 mg | Freq: Four times a day (QID) | RECTAL | Status: DC | PRN

## 2024-10-30 MED ORDER — MUPIROCIN CALCIUM 2 % EX CREA
TOPICAL_CREAM | Freq: Three times a day (TID) | CUTANEOUS | Status: DC
  Filled 2024-10-30 (×3): qty 15

## 2024-10-30 MED ORDER — ONDANSETRON HCL 4 MG/2ML IJ SOLN: 4.0000 mg | Freq: Four times a day (QID) | INTRAMUSCULAR | Status: DC | PRN

## 2024-10-30 MED ORDER — LORAZEPAM 0.5 MG PO TABS
0.5000 mg | ORAL_TABLET | Freq: Four times a day (QID) | ORAL | Status: DC | PRN
  Filled 2024-10-30: qty 1

## 2024-10-30 MED ORDER — CETIRIZINE HCL 10 MG PO TABS
10.0000 mg | ORAL_TABLET | Freq: Once | ORAL | Status: AC
  Administered 2024-10-30: 10 mg via ORAL
  Filled 2024-10-30: qty 1

## 2024-10-30 MED ORDER — LIDOCAINE HCL (PF) 1 % IJ SOLN
5.0000 mL | Freq: Once | INTRAMUSCULAR | Status: AC
  Administered 2024-10-30: 5 mL
  Filled 2024-10-30: qty 5

## 2024-10-30 MED ORDER — PREDNISONE 20 MG PO TABS
40.0000 mg | ORAL_TABLET | Freq: Two times a day (BID) | ORAL | Status: DC
  Administered 2024-10-30 – 2024-11-01 (×5): 40 mg via ORAL
  Filled 2024-10-30 (×5): qty 2

## 2024-10-30 MED ORDER — HYDROCHLOROTHIAZIDE 12.5 MG PO TABS
12.5000 mg | ORAL_TABLET | Freq: Every day | ORAL | Status: DC
  Administered 2024-10-30 – 2024-11-01 (×3): 12.5 mg via ORAL
  Filled 2024-10-30 (×3): qty 1

## 2024-10-30 MED ORDER — ROSUVASTATIN CALCIUM 10 MG PO TABS
10.0000 mg | ORAL_TABLET | Freq: Every day | ORAL | Status: DC
  Administered 2024-10-30 – 2024-11-01 (×3): 10 mg via ORAL
  Filled 2024-10-30 (×3): qty 1

## 2024-10-30 MED ORDER — FAMOTIDINE 20 MG PO TABS
20.0000 mg | ORAL_TABLET | Freq: Two times a day (BID) | ORAL | Status: DC
  Administered 2024-10-30 – 2024-11-01 (×3): 20 mg via ORAL
  Filled 2024-10-30 (×5): qty 1

## 2024-10-30 MED ORDER — VANCOMYCIN HCL 1000 MG/200ML IV SOLN
1000.0000 mg | Freq: Two times a day (BID) | INTRAVENOUS | Status: DC
  Filled 2024-10-30: qty 200

## 2024-10-30 NOTE — Consult Note (Signed)
 Pharmacy Antibiotic Note  Shane Reynolds is a 68 y.o. male admitted on 10/29/2024 with leftaxilla abscess s/p I&D in the ED.  Pharmacy has been consulted for vancomycin  dosing.  Height: 6' 3.5 (191.8 cm) Weight: 81.6 kg (180 lb) IBW/kg (Calculated) : 85.65  Plan:  Vancomycin  2,000 mg LD scheduled to given at 2100 on 12/14 Vancomycin  1,000 mg IV Q 12 hrs ordered to start at 0900 on 12/15 . Goal AUC 400-550. Obtain levels at steady state or as clinically indicated.   Expected AUC: 448.1 SCr used: 0.92  Weight used for dosing: TBW since <IBW Cmin: 12.3 Will monitor serum creatinine daily while on vanc. Follow renal function and cultures for adjustments      Height: 6' 3.5 (191.8 cm) Weight: 81.6 kg (180 lb) IBW/kg (Calculated) : 85.65  Temp (24hrs), Avg:98.4 F (36.9 C), Min:98 F (36.7 C), Max:98.9 F (37.2 C)  Recent Labs  Lab 10/30/24 0013  WBC 5.5  CREATININE 0.92  LATICACIDVEN 1.0    Estimated Creatinine Clearance: 86.2 mL/min (by C-G formula based on SCr of 0.92 mg/dL).    Allergies[1]  Antimicrobials this admission: 12/14 vancomycin  >>   Microbiology results:  12/14 MRSA PCR: pending  Thank you for allowing pharmacy to be a part of this patients care.  Annabella LOISE Banks, PharmD Clinical Pharmacist 10/30/2024 6:12 PM      [1]  Allergies Allergen Reactions   Codeine Nausea And Vomiting   Sulfa Antibiotics Rash

## 2024-10-30 NOTE — ED Provider Notes (Signed)
 5:50 AM Assumed care for off going team.   Blood pressure (!) 155/69, pulse (!) 58, temperature 98.4 F (36.9 C), temperature source Oral, resp. rate 16, height 6' 3.5 (1.918 m), weight 81.6 kg, SpO2 98%.  See their HPI for full report but in brief penidng labs CT  CT imaging reassuring labs are reassuring.  I wonder if this could be related to HSP. Does report recent viral illness.  Seems less likely Elspeth Louder syndrome given the location, and appearance of it but did recommend discontinuing doxycycline  out of precaution.  I did do an I&D of the left arm abscess and we will just do some mupirocin  ointment over it for now given a little bit of surrounding erythema I do want to hold off on starting additional antibiotic just to that we can monitor the rash without confounding treatment.  Patient is not septic.  Given the extensiveness of any of the rash I will discuss the hospital team for admission for IV steroids IV Benadryl  and monitoring of the rash.  .Incision and Drainage  Date/Time: 10/30/2024 5:51 AM  Performed by: Ernest Ronal BRAVO, MD Authorized by: Ernest Ronal BRAVO, MD   Consent:    Consent obtained:  Verbal   Consent given by:  Patient   Risks discussed:  Bleeding, incomplete drainage, pain, infection and damage to other organs Pre-procedure details:    Skin preparation:  Povidone-iodine Anesthesia:    Anesthesia method:  Local infiltration   Local anesthetic:  5 mL lidocaine  (XYLOCAINE ) injection 1 % (PF) Procedure type:    Complexity:  Simple Procedure details:    Ultrasound guidance: no     Incision types:  Stab incision   Drainage:  Bloody and purulent   Packing materials:  None Post-procedure details:    Procedure completion:  Tolerated well, no immediate complications           Ernest Ronal BRAVO, MD 10/30/24 [phone]

## 2024-10-30 NOTE — H&P (Signed)
 History and Physical    Shane Reynolds FMW:[id number] DOB: 06-Oct-1956 DOA: 10/29/2024  PCP: Cyrus Selinda Moose, PA-C (Confirm with patient/family/NH records and if not entered, this has to be entered at [facility] point of entry) Patient coming from: Home  I have personally briefly reviewed patient's old medical records in [facility]  Chief Complaint: Rash and itchiness of groin and buttocks  HPI: Shane Reynolds is a 68 y.o. male with medical history significant of HTN, HLD, anxiety/depression, GERD, IBS, presented with itchiness and a rash on the bilateral groins and buttocks.  Patient reported that last week his son developed a  MRSA infection and 2 days ago he started to have a boil develop under left-sided axilla area with pain and rash and PCP started him on doxycycline .  He took the first dose of doxycycline  in the early afternoon 2 PM on Friday then 3 hours later, he started to have severe itchiness and rash of bilateral groin area, scrotum, shaft of penis and buttocks.  Despite he took additional doxycycline  Saturday morning.  His itchiness and rash has been spreading to bilateral upper thighs and he also broke out several rash of his legs but denies any rash on the torso.  He felt very itchiness of this rash and started to use topical steroid with some relief.  Denied any fever chills denied any bleeding problems.  ED Course: Afebrile, nontachycardic blood pressure 130/60 O2 saturation 98% on room air.  Blood work showed WBC 5.5 hemoglobin 13.3 BUN 22 creatinine 0.9 glucose 95.  CT abdomen pelvis showed no significant acute findings associated with pelvis related to groin swelling and bruising.  Patient was given IV Solu-Medrol , Benadryl  in the ED.  ED physician also did a I&D of left axilla abscess.  Review of Systems: As per HPI otherwise 14 point review of systems negative.    Past Medical History:  Diagnosis Date   Acid reflux    Anemia    Depression    Genital  herpes    GERD (gastroesophageal reflux disease)    Hyperlipidemia    Hypertension    IBS (irritable bowel syndrome)    [facility] spotted fever     Past Surgical History:  Procedure Laterality Date   CHOLECYSTECTOMY     COLONOSCOPY WITH PROPOFOL  N/A 11/04/2023   Procedure: COLONOSCOPY WITH PROPOFOL ;  Surgeon: Toledo, Ladell POUR, MD;  Location: [facility] ENDOSCOPY;  Service: Gastroenterology;  Laterality: N/A;     reports that he has quit smoking. He has never used smokeless tobacco. He reports that he does not drink alcohol and does not use drugs.  Allergies[1]  Family History  Problem Relation Age of Onset   Diabetes Mother    Alcohol abuse Mother    Stroke Father    Skin cancer Sister    Colon cancer Neg Hx      Prior to Admission medications  Medication Sig Start Date End Date Taking? Authorizing Provider  acetaminophen  (TYLENOL ) 325 MG tablet Take 650 mg by mouth every 6 (six) hours as needed for mild pain (pain score 1-3).   Yes [provider]  aspirin  EC 81 MG tablet Take 81 mg by mouth daily. 02/10/24 02/09/25 Yes [provider]  dicyclomine  (BENTYL ) 20 MG tablet Take 1 tablet (20 mg total) by mouth 2 (two) times daily as needed for spasms. 01/03/21  Yes Nivia Colon, PA-C  diphenhydrAMINE  (BENADRYL ) 25 MG tablet Take 1 tablet (25 mg total) by mouth every 6 (six) hours. 08/14/18  Yes Baxter Drivers, MD  doxycycline  (VIBRA -TABS) 100 MG tablet Take 100 mg by mouth 2 (two) times daily. 10/28/24 11/07/24 Yes [provider]  famotidine  (PEPCID ) 20 MG tablet Take 1 tablet (20 mg total) by mouth 2 (two) times daily. 08/14/18  Yes Baxter Drivers, MD  Hyoscyamine  Sulfate SL 0.125 MG SUBL Place 0.125 mg under the tongue every 6 (six) hours as needed. 11/30/23  Yes [provider]  LORazepam  (ATIVAN ) 0.5 MG tablet Take 0.5 mg by mouth every 6 (six) hours as needed. 10/21/24 11/20/24 Yes [provider]  losartan -hydrochlorothiazide  (HYZAAR)  50-12.5 MG tablet Take 1 tablet by mouth daily.   Yes [provider]  omeprazole  (PRILOSEC) 20 MG capsule Take 1 capsule (20 mg total) by mouth daily. 01/03/21  Yes Nivia Colon, PA-C  rosuvastatin  (CRESTOR ) 10 MG tablet Take 10 mg by mouth daily.   Yes [provider]  pravastatin (PRAVACHOL) 10 MG tablet Take 10 mg by mouth daily. Patient not taking: Reported on 10/30/2024    [provider]  pseudoephedrine (SUDAFED) 30 MG tablet Take 30 mg by mouth every 4 (four) hours as needed for congestion. Patient not taking: Reported on 11/04/2023    [provider]  ranitidine (ZANTAC) 150 MG tablet Take 150 mg by mouth 2 (two) times daily. Patient not taking: Reported on 11/04/2023    [provider]    Physical Exam: Vitals:   10/30/24 0523 10/30/24 0608 10/30/24 0720 10/30/24 0922  BP: (!) 155/69 (!) 153/68  (!) 161/81  Pulse: (!) 58 (!) 59  (!) 59  Resp: 16 17  16   Temp: 98.4 F (36.9 C) 98.5 F (36.9 C)  98 F (36.7 C)  TempSrc: Oral Oral  Oral  SpO2: 98% 100% 100% 100%  Weight:      Height:        Constitutional: NAD, calm, comfortable Vitals:   10/30/24 0523 10/30/24 0608 10/30/24 0720 10/30/24 0922  BP: (!) 155/69 (!) 153/68  (!) 161/81  Pulse: (!) 58 (!) 59  (!) 59  Resp: 16 17  16   Temp: 98.4 F (36.9 C) 98.5 F (36.9 C)  98 F (36.7 C)  TempSrc: Oral Oral  Oral  SpO2: 98% 100% 100% 100%  Weight:      Height:       Eyes: PERRL, lids and conjunctivae normal ENMT: Mucous membranes are moist. Posterior pharynx clear of any exudate or lesions.Normal dentition.  Neck: normal, supple, no masses, no thyromegaly Respiratory: clear to auscultation bilaterally, no wheezing, no crackles. Normal respiratory effort. No accessory muscle use.  Cardiovascular: Regular rate and rhythm, no murmurs / rubs / gallops. No extremity edema. 2+ pedal pulses. No carotid bruits.  Abdomen: no tenderness, no masses palpated. No hepatosplenomegaly.  Bowel sounds positive.  Musculoskeletal: no clubbing / cyanosis. No joint deformity upper and lower extremities. Good ROM, no contractures. Normal muscle tone.  Skin: Large macular papular rash of bilateral groin and buttocks with itchiness, several smaller macular rash of bilateral lower extremity. Neurologic: CN 2-12 grossly intact. Sensation intact, DTR normal. Strength 5/5 in all 4.  Psychiatric: Normal judgment and insight. Alert and oriented x 3. Normal mood.     Labs on Admission: I have personally reviewed following labs and imaging studies  CBC: Recent Labs  Lab 10/30/24 0013  WBC 5.5  HGB 13.3  HCT 39.2  MCV 98.2  PLT 175   Basic Metabolic Panel: Recent Labs  Lab 10/30/24 0013  NA 138  K  3.9  CL 102  CO2 22  GLUCOSE 95  BUN 22  CREATININE 0.92  CALCIUM  9.8   GFR: Estimated Creatinine Clearance: 86.2 mL/min (by C-G formula based on SCr of 0.92 mg/dL). Liver Function Tests: Recent Labs  Lab 10/30/24 0013  AST 24  ALT 24  ALKPHOS 55  BILITOT 0.8  PROT 7.5  ALBUMIN 4.3   No results for input(s): LIPASE, AMYLASE in the last 168 hours. No results for input(s): AMMONIA in the last 168 hours. Coagulation Profile: No results for input(s): INR, PROTIME in the last 168 hours. Cardiac Enzymes: No results for input(s): CKTOTAL, CKMB, CKMBINDEX, TROPONINI in the last 168 hours. BNP (last 3 results) No results for input(s): PROBNP in the last 8760 hours. HbA1C: No results for input(s): HGBA1C in the last 72 hours. CBG: No results for input(s): GLUCAP in the last 168 hours. Lipid Profile: No results for input(s): CHOL, HDL, LDLCALC, TRIG, CHOLHDL, LDLDIRECT in the last 72 hours. Thyroid Function Tests: No results for input(s): TSH, T4TOTAL, FREET4, T3FREE, THYROIDAB in the last 72 hours. Anemia Panel: No results for input(s): VITAMINB12, FOLATE, FERRITIN, TIBC, IRON, RETICCTPCT in the last 72  hours. Urine analysis:    Component Value Date/Time   COLORURINE COLORLESS (A) 10/30/2024 0013   APPEARANCEUR CLEAR (A) 10/30/2024 0013   LABSPEC 1.005 10/30/2024 0013   PHURINE 5.0 10/30/2024 0013   GLUCOSEU NEGATIVE 10/30/2024 0013   HGBUR NEGATIVE 10/30/2024 0013   HGBUR negative 07/11/2010 1016   BILIRUBINUR NEGATIVE 10/30/2024 0013   KETONESUR NEGATIVE 10/30/2024 0013   PROTEINUR NEGATIVE 10/30/2024 0013   UROBILINOGEN 0.2 09/07/2013 1119   NITRITE NEGATIVE 10/30/2024 0013   LEUKOCYTESUR NEGATIVE 10/30/2024 0013    Radiological Exams on Admission: CT ABDOMEN PELVIS W CONTRAST Result Date: 10/30/2024 EXAM: CT ABDOMEN AND PELVIS WITH CONTRAST 10/30/2024 03:12:46 AM TECHNIQUE: CT of the abdomen and pelvis was performed with the administration of 100 mL of iohexol  (OMNIPAQUE ) 300 MG/ML solution. Multiplanar reformatted images are provided for review. Automated exposure control, iterative reconstruction, and/or weight-based adjustment of the mA/kV was utilized to reduce the radiation dose to as low as reasonably achievable. COMPARISON: 12/30/2021. CLINICAL HISTORY: Discoloration in the groin and scrotum area. FINDINGS: LOWER CHEST: No acute abnormality. LIVER: The liver is unremarkable. GALLBLADDER AND BILE DUCTS: The gallbladder has been surgically removed. No biliary ductal dilatation. SPLEEN: No acute abnormality. PANCREAS: No acute abnormality. ADRENAL GLANDS: No acute abnormality. KIDNEYS, URETERS AND BLADDER: Kidneys show no obstructive or inflammatory changes. No calculi are seen. No hydronephrosis. The bladder is well distended. GI AND BOWEL: Stomach and small bowel are within normal limits. No obstructive or inflammatory changes of the colon are seen. The appendix is air-filled and within normal limits. There is no bowel obstruction. PERITONEUM AND RETROPERITONEUM: No ascites. No free air. VASCULATURE: Aortic calcifications are seen without aneurysmal dilatation. LYMPH NODES: No  lymphadenopathy. REPRODUCTIVE ORGANS: The prostate is within normal limits. BONES AND SOFT TISSUES: No acute osseous abnormality. No focal soft tissue abnormality. IMPRESSION: 1. No acute findings in the abdomen or pelvis related to groin swelling and bruising. Electronically signed by: Oneil Devonshire MD 10/30/2024 03:23 AM EST RP Workstation: [id number]   US  SCROTUM W/DOPPLER Result Date: 10/29/2024 EXAM: ULTRASOUND SCROTUM/TESTICLES WITH DOPPLER FLOW EVALUATION 10/29/2024 11:36:40 PM TECHNIQUE: Duplex ultrasound using B-mode/gray scaled imaging, Doppler spectral analysis and color flow Doppler was obtained of the testicles. COMPARISON: None available. CLINICAL HISTORY: Testicle pain. FINDINGS: RIGHT: GREY SCALE: The right testicle measures 4.0 x 2.2 x 2.6  cm. It demonstrates normal homogeneous echotexture without focal lesion. No testicular microlithiasis. DOPPLER EVALUATION: There is normal arterial and venous Doppler flow within the testicle. VARICOCELE: Unilateral right varicocele. SCROTAL SAC: Small bilateral hydroceles. EPIDIDYMIS: 12 mm right epididymal cyst. LEFT: GREY SCALE: The left testicle measures 3.5 x 2.5 x 2.7 cm. It demonstrates normal homogeneous echotexture without focal lesion. No testicular microlithiasis. DOPPLER EVALUATION: There is normal arterial and venous Doppler flow within the testicle. VARICOCELE: No scrotal varicocele. SCROTAL SAC: Small bilateral hydroceles. EPIDIDYMIS: No acute abnormality. IMPRESSION: 1. No testicular torsion. 2. Small bilateral hydroceles. 3. 12 mm right epididymal cyst. Electronically signed by: Pinkie Pebbles MD 10/29/2024 11:42 PM EST RP Workstation: [id number]    EKG: None Assessment/Plan Principal Problem:   Axillary abscess Active Problems:   Drug rash  (please populate well all problems here in Problem List. (For example, if patient is on BP meds at home and you resume or decide to hold them, it is a problem that needs to be her. Same for CAD,  COPD, HLD and so on)  Drug rash - Likely doxycycline  allergy.  Patient unlikely that he was taking Doxy before and did not have any problems. - Patient agreed to stop doxycycline .  Will change to Zyvox  for now.  Send MRSA screen, if negative can consider further downgrade to p.o. antibiotics on discharge home. - Patient reported somewhat improvement after IV Solu-Medrol  and p.o. Benadryl .  For now we will continue prednisone  40 mg twice daily, and as needed Benadryl  for symptoms.  Kenalog  4 times daily topical. - Other DDx, low suspicion for Henoch Schonlein Purpura as the lesions are blanchable. CT abd pelvis reassuring.  Left axilla abscess - Status post I&D in the ED - Send MRSA screening - Patient claimed that he also has Bactrim allergy, for now we will cover him with Zyvox .  HTN HLD - Stable, continue home BP meds - Continue statin  DVT prophylaxis: SCD Code Status: Full code Family Communication: Wife at bedside Disposition Plan: Expect less than 2 midnight hospital stay Consults called: None Admission status: MedSurg observation   Cort ONEIDA Mana MD [facility] Pager [phone]  10/30/2024, 11:13 AM       [1]  Allergies Allergen Reactions   Codeine Nausea And Vomiting   Sulfa Antibiotics Rash

## 2024-10-30 NOTE — Progress Notes (Signed)
° °  Brief Progress Note   _____________________________________________________________________________________________________________  Patient Name: Shane Reynolds Patient DOB: 04-18-1956 Date: @TODAY @      Data: Reviewed vital signs, labs, and clinical notes.    Action: No action required at this time.    Response:  Bed assigned.  _____________________________________________________________________________________________________________  [facility] RN Expeditor Cash Duce S Ludmila Ebarb Please contact us  directly via secure chat (search for [facility]) or by calling us  at [phone] [facility]).

## 2024-10-30 NOTE — ED Notes (Signed)
Pharmacy called about medication 

## 2024-10-30 NOTE — Progress Notes (Signed)
 Patient refused Zyvox . Will give one dose of IV Vanco.

## 2024-10-31 LAB — BASIC METABOLIC PANEL WITH GFR
Anion gap: 13 (ref 5–15)
BUN: 28 mg/dL — ABNORMAL HIGH (ref 8–23)
CO2: 22 mmol/L (ref 22–32)
Calcium: 9.7 mg/dL (ref 8.9–10.3)
Chloride: 102 mmol/L (ref 98–111)
Creatinine, Ser: 0.97 mg/dL (ref 0.61–1.24)
GFR, Estimated: >60 mL/min (ref >60)
Glucose, Bld: 132 mg/dL — ABNORMAL HIGH (ref 70–99)
Potassium: 4 mmol/L (ref 3.5–5.1)
Sodium: 137 mmol/L (ref 135–145)

## 2024-10-31 MED ORDER — FAMOTIDINE IN NACL 20-0.9 MG/50ML-% IV SOLN
20.0000 mg | Freq: Once | INTRAVENOUS | Status: AC
  Administered 2024-10-31: 05:00:00 20 mg via INTRAVENOUS
  Filled 2024-10-31: qty 50

## 2024-10-31 MED ORDER — DIPHENHYDRAMINE HCL 50 MG/ML IJ SOLN: 50.0000 mg | Freq: Once | INTRAMUSCULAR | Status: DC

## 2024-10-31 MED ORDER — SACCHAROMYCES BOULARDII 250 MG PO CAPS
250.0000 mg | ORAL_CAPSULE | Freq: Two times a day (BID) | ORAL | Status: DC
  Administered 2024-10-31 – 2024-11-01 (×3): 250 mg via ORAL
  Filled 2024-10-31 (×3): qty 1

## 2024-10-31 MED ORDER — DIPHENHYDRAMINE HCL 50 MG/ML IJ SOLN
25.0000 mg | Freq: Once | INTRAMUSCULAR | Status: AC
  Administered 2024-10-31: 05:00:00 25 mg via INTRAVENOUS
  Filled 2024-10-31: qty 1

## 2024-10-31 MED ORDER — VANCOMYCIN HCL 1000 MG/200ML IV SOLN
1000.0000 mg | Freq: Two times a day (BID) | INTRAVENOUS | Status: DC
  Filled 2024-10-31 (×2): qty 200

## 2024-10-31 MED ORDER — VANCOMYCIN HCL 1000 MG/200ML IV SOLN
1000.0000 mg | Freq: Two times a day (BID) | INTRAVENOUS | Status: DC
  Administered 2024-10-31 – 2024-11-01 (×3): 1000 mg via INTRAVENOUS
  Filled 2024-10-31 (×4): qty 200

## 2024-10-31 NOTE — TOC CM/SW Note (Signed)
 Transition of Care [facility]) - Inpatient Brief Assessment   Patient Details  Name: Shane Reynolds MRN: [id number] Date of Birth: 01-Jan-1956  Transition of Care [facility]) CM/SW Contact:    Corean ONEIDA Haddock, RN Phone Number: 10/31/2024, 1:52 PM   Clinical Narrative:  Transition of Care Department [facility]) has reviewed patient and no TOC needs have been identified at this time.  If new patient transition needs arise, please place a TOC consult.   Transition of Care Asessment: Insurance and Status: Selfpay Patient has primary care physician: Yes     Prior/Current Home Services: No current home services Social Drivers of Health Review: SDOH reviewed no interventions necessary Readmission risk has been reviewed: No (obs status, no score generated) Transition of care needs: no transition of care needs at this time

## 2024-10-31 NOTE — Progress Notes (Signed)
 PROGRESS NOTE    Shane Reynolds  FMW:[id number] DOB: 03-Jun-1956 DOA: 10/29/2024 PCP: Cyrus Selinda Moose, PA-C  Assessment & Plan:   Principal Problem:   Axillary abscess Active Problems:   Drug rash  Assessment and Plan:  Drug rash: possibly to doxycycline  allergy. Continue on steroids, IV vanco. CT abd pelvis reassuring. MRSA screen was positive. 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 Dosage and rate have been adjusted. Will monitor and d/c if it happens again    Left axilla abscess: s/p I&D in the ER. MRSA screen was positive  HTN: continue on losartan , HCTZ  HLD: continue on statin       DVT prophylaxis: lovenox  Code Status: full  Family Communication: discussed pt's care w/ pt's son via phone and answered his questions  Disposition Plan: likely d/c back home   Level of care: Med-Surg  Status is: Observation The patient remains OBS appropriate and will d/c before 2 midnights.    Consultants:    Procedures:   Antimicrobials: IV vanco   Subjective: Pt c/o rash   Objective: Vitals:   10/30/24 1605 10/30/24 2008 10/31/24 0538 10/31/24 0820  BP: (!) 143/77 (!) 158/61 (!) 140/74 132/70  Pulse: (!) 51 (!) 58 90 (!) 48  Resp: 17 16 16 18   Temp: 98.1 F (36.7 C) 97.8 F (36.6 C) 98.2 F (36.8 C) (!) 97.3 F (36.3 C)  TempSrc: Oral Oral  Oral  SpO2: 100% 99% 100% 100%  Weight:      Height:        Intake/Output Summary (Last 24 hours) at 10/31/2024 1025 Last data filed at 10/31/2024 0900 Gross per 24 hour  Intake 480 ml  Output --  Net 480 ml   Filed Weights   10/29/24 2210  Weight: 81.6 kg    Examination:  General exam: Appears calm and comfortable  Respiratory system: Clear to auscultation. Respiratory effort normal. Cardiovascular system: S1 & S2 +. No rubs, gallops or clicks.  Gastrointestinal system: Abdomen is  nondistended, soft and nontender.  Normal bowel sounds heard. Central nervous system: Alert and oriented. Moves all extremities Psychiatry: Judgement and insight appear normal. Mood & affect appropriate.     Data Reviewed: I have personally reviewed following labs and imaging studies  CBC: Recent Labs  Lab 10/30/24 0013 10/30/24 1823  WBC 5.5 8.1  NEUTROABS  --  7.1  HGB 13.3 14.5  HCT 39.2 42.2  MCV 98.2 97.9  PLT 175 222   Basic Metabolic Panel: Recent Labs  Lab 10/30/24 0013 10/31/24 0425  NA 138 137  K 3.9 4.0  CL 102 102  CO2 22 22  GLUCOSE 95 132*  BUN 22 28*  CREATININE 0.92 0.97  CALCIUM  9.8 9.7   GFR: Estimated Creatinine Clearance: 81.8 mL/min (by C-G formula based on SCr of 0.97 mg/dL). Liver Function Tests: Recent Labs  Lab 10/30/24 0013  AST 24  ALT 24  ALKPHOS 55  BILITOT 0.8  PROT 7.5  ALBUMIN 4.3   No results for input(s): LIPASE, AMYLASE in the last 168 hours. No results for input(s): AMMONIA in the last 168 hours. Coagulation Profile: No results for input(s): INR, PROTIME in the last 168 hours. Cardiac Enzymes: No results for input(s): CKTOTAL, CKMB, CKMBINDEX, TROPONINI in the last 168 hours. BNP (last 3 results) No results for input(s): PROBNP in the last 8760 hours. HbA1C: No  results for input(s): HGBA1C in the last 72 hours. CBG: No results for input(s): GLUCAP in the last 168 hours. Lipid Profile: No results for input(s): CHOL, HDL, LDLCALC, TRIG, CHOLHDL, LDLDIRECT in the last 72 hours. Thyroid Function Tests: No results for input(s): TSH, T4TOTAL, FREET4, T3FREE, THYROIDAB in the last 72 hours. Anemia Panel: No results for input(s): VITAMINB12, FOLATE, FERRITIN, TIBC, IRON, RETICCTPCT in the last 72 hours. Sepsis Labs: Recent Labs  Lab 10/30/24 0013  LATICACIDVEN 1.0    Recent Results (from the past 240 hours)  MRSA Next Gen by PCR, Nasal     Status: Abnormal    Collection Time: 10/30/24  5:06 PM   Specimen: Nasal Mucosa; Nasal Swab  Result Value Ref Range Status   MRSA by PCR Next Gen DETECTED (A) NOT DETECTED Final    Comment: RESULT CALLED TO, READ BACK BY AND VERIFIED WITH: KRISTY TAYLOR 10/30/24 1930 KLW (NOTE) The GeneXpert MRSA Assay (FDA approved for NASAL specimens only), is one component of a comprehensive MRSA colonization surveillance program. It is not intended to diagnose MRSA infection nor to guide or monitor treatment for MRSA infections. Test performance is not FDA approved in patients less than 70 years old. Performed at [facility], [address] [place], [place] [address]          Radiology Studies: CT ABDOMEN PELVIS W CONTRAST Result Date: 10/30/2024 EXAM: CT ABDOMEN AND PELVIS WITH CONTRAST 10/30/2024 03:12:46 AM TECHNIQUE: CT of the abdomen and pelvis was performed with the administration of 100 mL of iohexol  (OMNIPAQUE ) 300 MG/ML solution. Multiplanar reformatted images are provided for review. Automated exposure control, iterative reconstruction, and/or weight-based adjustment of the mA/kV was utilized to reduce the radiation dose to as low as reasonably achievable. COMPARISON: 12/30/2021. CLINICAL HISTORY: Discoloration in the groin and scrotum area. FINDINGS: LOWER CHEST: No acute abnormality. LIVER: The liver is unremarkable. GALLBLADDER AND BILE DUCTS: The gallbladder has been surgically removed. No biliary ductal dilatation. SPLEEN: No acute abnormality. PANCREAS: No acute abnormality. ADRENAL GLANDS: No acute abnormality. KIDNEYS, URETERS AND BLADDER: Kidneys show no obstructive or inflammatory changes. No calculi are seen. No hydronephrosis. The bladder is well distended. GI AND BOWEL: Stomach and small bowel are within normal limits. No obstructive or inflammatory changes of the colon are seen. The appendix is air-filled and within normal limits. There is no bowel obstruction. PERITONEUM AND  RETROPERITONEUM: No ascites. No free air. VASCULATURE: Aortic calcifications are seen without aneurysmal dilatation. LYMPH NODES: No lymphadenopathy. REPRODUCTIVE ORGANS: The prostate is within normal limits. BONES AND SOFT TISSUES: No acute osseous abnormality. No focal soft tissue abnormality. IMPRESSION: 1. No acute findings in the abdomen or pelvis related to groin swelling and bruising. Electronically signed by: Oneil Devonshire MD 10/30/2024 03:23 AM EST RP Workstation: [id number]   US  SCROTUM W/DOPPLER Result Date: 10/29/2024 EXAM: ULTRASOUND SCROTUM/TESTICLES WITH DOPPLER FLOW EVALUATION 10/29/2024 11:36:40 PM TECHNIQUE: Duplex ultrasound using B-mode/gray scaled imaging, Doppler spectral analysis and color flow Doppler was obtained of the testicles. COMPARISON: None available. CLINICAL HISTORY: Testicle pain. FINDINGS: RIGHT: GREY SCALE: The right testicle measures 4.0 x 2.2 x 2.6 cm. It demonstrates normal homogeneous echotexture without focal lesion. No testicular microlithiasis. DOPPLER EVALUATION: There is normal arterial and venous Doppler flow within the testicle. VARICOCELE: Unilateral right varicocele. SCROTAL SAC: Small bilateral hydroceles. EPIDIDYMIS: 12 mm right epididymal cyst. LEFT: GREY SCALE: The left testicle measures 3.5 x 2.5 x 2.7 cm. It demonstrates normal homogeneous echotexture without focal lesion. No testicular microlithiasis. DOPPLER EVALUATION:  There is normal arterial and venous Doppler flow within the testicle. VARICOCELE: No scrotal varicocele. SCROTAL SAC: Small bilateral hydroceles. EPIDIDYMIS: No acute abnormality. IMPRESSION: 1. No testicular torsion. 2. Small bilateral hydroceles. 3. 12 mm right epididymal cyst. Electronically signed by: Pinkie Pebbles MD 10/29/2024 11:42 PM EST RP Workstation: [id number]        Scheduled Meds:  aspirin  EC  81 mg Oral Daily   diphenhydrAMINE   25 mg Oral Q6H   famotidine   20 mg Oral BID   losartan   50 mg Oral Daily   And    hydrochlorothiazide   12.5 mg Oral Daily   mupirocin  cream   Topical TID   predniSONE   40 mg Oral BID WC   rosuvastatin   10 mg Oral Daily   triamcinolone  ointment   Topical QID   Continuous Infusions:  vancomycin  HCl       LOS: 0 days       Anthony CHRISTELLA Pouch, MD [facility] Pager [phone]  If 7PM-7AM, please contact night-coverage [email or web address] 10/31/2024, 10:25 AM

## 2024-10-31 NOTE — Plan of Care (Signed)

## 2024-11-01 ENCOUNTER — Other Ambulatory Visit: Payer: Self-pay

## 2024-11-01 MED ORDER — LINEZOLID 600 MG PO TABS
600.0000 mg | ORAL_TABLET | Freq: Two times a day (BID) | ORAL | 0 refills | Status: AC
  Filled 2024-11-01: qty 6, 3d supply, fill #0

## 2024-11-01 NOTE — Progress Notes (Signed)
 Mobility Specialist Progress Note:    11/01/24 1104  Mobility  Activity Ambulated independently  Level of Assistance Independent  Assistive Device None  Distance Ambulated (ft) 1000 ft  Range of Motion/Exercises Active;All extremities  Activity Response Tolerated well  Mobility visit 1 Mobility  Mobility Specialist Start Time (ACUTE ONLY) 1057  Mobility Specialist Stop Time (ACUTE ONLY) 1122  Mobility Specialist Time Calculation (min) (ACUTE ONLY) 25 min   Pt received sitting EOB, eager for mobility. Independently able to stand and ambulate with no AD. Tolerated well, asx throughout. Returned to room, all needs met.   Sherrilee Ditty Mobility Specialist Please contact via Special Educational Needs Teacher or  Rehab office at [phone]

## 2024-11-01 NOTE — Discharge Summary (Signed)
 Physician Discharge Summary  Shane Reynolds FMW:[id number] DOB: 16-Oct-1956 DOA: 10/29/2024  PCP: Cyrus Selinda Moose, PA-C  Admit date: 10/29/2024 Discharge date: 11/01/2024  Admitted From: home  Disposition:  home   Recommendations for Outpatient Follow-up:  Follow up with PCP in 1-2 weeks   Home Health: no  Equipment/Devices:   Discharge Condition: stable CODE STATUS: full  Diet recommendation: Heart Healthy  Brief/Interim Summary: HPI was taken from Dr. Laurita:  Shane Reynolds is a 68 y.o. male with medical history significant of HTN, HLD, anxiety/depression, GERD, IBS, presented with itchiness and a rash on the bilateral groins and buttocks.   Patient reported that last week his son developed a  MRSA infection and 2 days ago he started to have a boil develop under left-sided axilla area with pain and rash and PCP started him on doxycycline .  He took the first dose of doxycycline  in the early afternoon 2 PM on Friday then 3 hours later, he started to have severe itchiness and rash of bilateral groin area, scrotum, shaft of penis and buttocks.  Despite he took additional doxycycline  Saturday morning.  His itchiness and rash has been spreading to bilateral upper thighs and he also broke out several rash of his legs but denies any rash on the torso.  He felt very itchiness of this rash and started to use topical steroid with some relief.  Denied any fever chills denied any bleeding problems.   ED Course: Afebrile, nontachycardic blood pressure 130/60 O2 saturation 98% on room air.  Blood work showed WBC 5.5 hemoglobin 13.3 BUN 22 creatinine 0.9 glucose 95.  CT abdomen pelvis showed no significant acute findings associated with pelvis related to groin swelling and bruising.   Patient was given IV Solu-Medrol , Benadryl  in the ED.  ED physician also did a I&D of left axilla abscess.  Discharge Diagnoses:  Principal Problem:   Axillary abscess Active Problems:   Drug  rash Drug rash: possibly to doxycycline  allergy. Continue on steroids, IV vanco and d/c home linezolid  to complete the course. CT abd pelvis reassuring. MRSA screen was positive. 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 Dosage and rate have been adjusted. Will monitor and d/c if it happens again    Left axilla abscess: s/p I&D in the ER. MRSA screen was positive  HTN: continue on losartan , HCTZ  HLD: continue on statin    Discharge Instructions  Discharge Instructions     Diet - low sodium heart healthy   Complete by: As directed    Discharge instructions   Complete by: As directed    F/u w/ PCP in 1-2 weeks.   Increase activity slowly   Complete by: As directed    No wound care   Complete by: As directed       Allergies as of 11/01/2024       Reactions   Codeine Nausea And Vomiting   Sulfa Antibiotics Rash        Medication List     STOP taking these medications    doxycycline  100 MG tablet Commonly known as: VIBRA -TABS   pravastatin 10 MG tablet Commonly known as: PRAVACHOL   pseudoephedrine 30 MG tablet Commonly known as: SUDAFED   ranitidine 150 MG tablet Commonly known as: ZANTAC       TAKE these medications    acetaminophen  325 MG tablet Commonly known as: TYLENOL   Take 650 mg by mouth every 6 (six) hours as needed for mild pain (pain score 1-3).   aspirin  EC 81 MG tablet Take 81 mg by mouth daily.   dicyclomine  20 MG tablet Commonly known as: BENTYL  Take 1 tablet (20 mg total) by mouth 2 (two) times daily as needed for spasms.   diphenhydrAMINE  25 MG tablet Commonly known as: BENADRYL  Take 1 tablet (25 mg total) by mouth every 6 (six) hours.   famotidine  20 MG tablet Commonly known as: PEPCID  Take 1 tablet (20 mg total) by mouth 2 (two) times daily.   Hyoscyamine  Sulfate SL 0.125 MG Subl Place 0.125 mg under the tongue every  6 (six) hours as needed.   linezolid  600 MG tablet Commonly known as: ZYVOX  Take 1 tablet (600 mg total) by mouth 2 (two) times daily for 3 days.   LORazepam  0.5 MG tablet Commonly known as: ATIVAN  Take 0.5 mg by mouth every 6 (six) hours as needed.   losartan -hydrochlorothiazide  50-12.5 MG tablet Commonly known as: HYZAAR Take 1 tablet by mouth daily.   omeprazole  20 MG capsule Commonly known as: PRILOSEC Take 1 capsule (20 mg total) by mouth daily.   rosuvastatin  10 MG tablet Commonly known as: CRESTOR  Take 10 mg by mouth daily.        Allergies[1]  Consultations:    Procedures/Studies: CT ABDOMEN PELVIS W CONTRAST Result Date: 10/30/2024 EXAM: CT ABDOMEN AND PELVIS WITH CONTRAST 10/30/2024 03:12:46 AM TECHNIQUE: CT of the abdomen and pelvis was performed with the administration of 100 mL of iohexol  (OMNIPAQUE ) 300 MG/ML solution. Multiplanar reformatted images are provided for review. Automated exposure control, iterative reconstruction, and/or weight-based adjustment of the mA/kV was utilized to reduce the radiation dose to as low as reasonably achievable. COMPARISON: 12/30/2021. CLINICAL HISTORY: Discoloration in the groin and scrotum area. FINDINGS: LOWER CHEST: No acute abnormality. LIVER: The liver is unremarkable. GALLBLADDER AND BILE DUCTS: The gallbladder has been surgically removed. No biliary ductal dilatation. SPLEEN: No acute abnormality. PANCREAS: No acute abnormality. ADRENAL GLANDS: No acute abnormality. KIDNEYS, URETERS AND BLADDER: Kidneys show no obstructive or inflammatory changes. No calculi are seen. No hydronephrosis. The bladder is well distended. GI AND BOWEL: Stomach and small bowel are within normal limits. No obstructive or inflammatory changes of the colon are seen. The appendix is air-filled and within normal limits. There is no bowel obstruction. PERITONEUM AND RETROPERITONEUM: No ascites. No free air. VASCULATURE: Aortic calcifications are seen  without aneurysmal dilatation. LYMPH NODES: No lymphadenopathy. REPRODUCTIVE ORGANS: The prostate is within normal limits. BONES AND SOFT TISSUES: No acute osseous abnormality. No focal soft tissue abnormality. IMPRESSION: 1. No acute findings in the abdomen or pelvis related to groin swelling and bruising. Electronically signed by: Oneil Devonshire MD 10/30/2024 03:23 AM EST RP Workstation: [id number]   US  SCROTUM W/DOPPLER Result Date: 10/29/2024 EXAM: ULTRASOUND SCROTUM/TESTICLES WITH DOPPLER FLOW EVALUATION 10/29/2024 11:36:40 PM TECHNIQUE: Duplex ultrasound using B-mode/gray scaled imaging, Doppler spectral analysis and color flow Doppler was obtained of the testicles. COMPARISON: None available. CLINICAL HISTORY: Testicle pain. FINDINGS: RIGHT: GREY SCALE: The right testicle measures 4.0 x 2.2 x 2.6 cm. It demonstrates normal homogeneous echotexture without focal lesion. No testicular microlithiasis. DOPPLER EVALUATION: There is normal arterial and venous Doppler flow within the testicle. VARICOCELE: Unilateral right varicocele. SCROTAL SAC: Small bilateral hydroceles. EPIDIDYMIS: 12 mm right epididymal cyst. LEFT: GREY SCALE: The left testicle measures 3.5 x 2.5 x 2.7 cm. It demonstrates normal homogeneous echotexture without focal lesion. No testicular microlithiasis. DOPPLER  EVALUATION: There is normal arterial and venous Doppler flow within the testicle. VARICOCELE: No scrotal varicocele. SCROTAL SAC: Small bilateral hydroceles. EPIDIDYMIS: No acute abnormality. IMPRESSION: 1. No testicular torsion. 2. Small bilateral hydroceles. 3. 12 mm right epididymal cyst. Electronically signed by: Pinkie Pebbles MD 10/29/2024 11:42 PM EST RP Workstation: [id number]   (Echo, Carotid, EGD, Colonoscopy, ERCP)    Subjective: Pt c/o fatigue    Discharge Exam: Vitals:   11/01/24 0316 11/01/24 0758  BP: 120/61 130/70  Pulse: 62 (!) 44  Resp: 16 18  Temp: 97.9 F (36.6 C) 98 F (36.7 C)  SpO2: 100% 98%    Vitals:   10/31/24 1531 10/31/24 2038 11/01/24 0316 11/01/24 0758  BP: 138/80 (!) 156/88 120/61 130/70  Pulse: (!) 55 (!) 56 62 (!) 44  Resp: 18 18 16 18   Temp: 98.4 F (36.9 C) 98.1 F (36.7 C) 97.9 F (36.6 C) 98 F (36.7 C)  TempSrc:    Oral  SpO2: 100% 99% 100% 98%  Weight:      Height:        General: Pt is alert, awake, not in acute distress Cardiovascular: S1/S2 +, no rubs, no gallops Respiratory: CTA bilaterally, no wheezing, no rhonchi Abdominal: Soft, NT, ND, bowel sounds + Extremities:  no cyanosis    The results of significant diagnostics from this hospitalization (including imaging, microbiology, ancillary and laboratory) are listed below for reference.     Microbiology: Recent Results (from the past 240 hours)  MRSA Next Gen by PCR, Nasal     Status: Abnormal   Collection Time: 10/30/24  5:06 PM   Specimen: Nasal Mucosa; Nasal Swab  Result Value Ref Range Status   MRSA by PCR Next Gen DETECTED (A) NOT DETECTED Final    Comment: RESULT CALLED TO, READ BACK BY AND VERIFIED WITH: KRISTY TAYLOR 10/30/24 1930 KLW (NOTE) The GeneXpert MRSA Assay (FDA approved for NASAL specimens only), is one component of a comprehensive MRSA colonization surveillance program. It is not intended to diagnose MRSA infection nor to guide or monitor treatment for MRSA infections. Test performance is not FDA approved in patients less than 54 years old. Performed at [facility], [address] [place], [place] [address]      Labs: BNP (last 3 results) No results for input(s): BNP in the last 8760 hours. Basic Metabolic Panel: Recent Labs  Lab 10/30/24 0013 10/31/24 0425  NA 138 137  K 3.9 4.0  CL 102 102  CO2 22 22  GLUCOSE 95 132*  BUN 22 28*  CREATININE 0.92 0.97  CALCIUM  9.8 9.7   Liver Function Tests: Recent Labs  Lab 10/30/24 0013  AST 24  ALT 24  ALKPHOS 55  BILITOT 0.8  PROT 7.5  ALBUMIN 4.3   No results for input(s): LIPASE,  AMYLASE in the last 168 hours. No results for input(s): AMMONIA in the last 168 hours. CBC: Recent Labs  Lab 10/30/24 0013 10/30/24 1823  WBC 5.5 8.1  NEUTROABS  --  7.1  HGB 13.3 14.5  HCT 39.2 42.2  MCV 98.2 97.9  PLT 175 222   Cardiac Enzymes: No results for input(s): CKTOTAL, CKMB, CKMBINDEX, TROPONINI in the last 168 hours. BNP: Invalid input(s): POCBNP CBG: No results for input(s): GLUCAP in the last 168 hours. D-Dimer No results for input(s): DDIMER in the last 72 hours. Hgb A1c No results for input(s): HGBA1C in the last 72 hours. Lipid Profile No results for input(s): CHOL, HDL, LDLCALC, TRIG, CHOLHDL, LDLDIRECT  in the last 72 hours. Thyroid function studies No results for input(s): TSH, T4TOTAL, T3FREE, THYROIDAB in the last 72 hours.  Invalid input(s): FREET3 Anemia work up No results for input(s): VITAMINB12, FOLATE, FERRITIN, TIBC, IRON, RETICCTPCT in the last 72 hours. Urinalysis    Component Value Date/Time   COLORURINE COLORLESS (A) 10/30/2024 0013   APPEARANCEUR CLEAR (A) 10/30/2024 0013   LABSPEC 1.005 10/30/2024 0013   PHURINE 5.0 10/30/2024 0013   GLUCOSEU NEGATIVE 10/30/2024 0013   HGBUR NEGATIVE 10/30/2024 0013   HGBUR negative 07/11/2010 1016   BILIRUBINUR NEGATIVE 10/30/2024 0013   KETONESUR NEGATIVE 10/30/2024 0013   PROTEINUR NEGATIVE 10/30/2024 0013   UROBILINOGEN 0.2 09/07/2013 1119   NITRITE NEGATIVE 10/30/2024 0013   LEUKOCYTESUR NEGATIVE 10/30/2024 0013   Sepsis Labs Recent Labs  Lab 10/30/24 0013 10/30/24 1823  WBC 5.5 8.1   Microbiology Recent Results (from the past 240 hours)  MRSA Next Gen by PCR, Nasal     Status: Abnormal   Collection Time: 10/30/24  5:06 PM   Specimen: Nasal Mucosa; Nasal Swab  Result Value Ref Range Status   MRSA by PCR Next Gen DETECTED (A) NOT DETECTED Final    Comment: RESULT CALLED TO, READ BACK BY AND VERIFIED WITH: KRISTY TAYLOR 10/30/24  1930 KLW (NOTE) The GeneXpert MRSA Assay (FDA approved for NASAL specimens only), is one component of a comprehensive MRSA colonization surveillance program. It is not intended to diagnose MRSA infection nor to guide or monitor treatment for MRSA infections. Test performance is not FDA approved in patients less than 62 years old. Performed at [facility], [address], [place], [place] [address]      Time coordinating discharge: 36 minutes  SIGNED:   Anthony CHRISTELLA Pouch, MD  [facility] 11/01/2024, 12:12 PM Pager   If 7PM-7AM, please contact night-coverage [email or web address]     [1]  Allergies Allergen Reactions   Codeine Nausea And Vomiting   Sulfa Antibiotics Rash

## 2024-11-15 ENCOUNTER — Encounter: Payer: Self-pay | Admitting: Infectious Diseases

## 2024-11-15 ENCOUNTER — Other Ambulatory Visit
Admission: RE | Admit: 2024-11-15 | Discharge: 2024-11-15 | Disposition: A | Discharge status: Home / Self Care | Payer: Self-pay | Attending: Infectious Diseases | Admitting: Infectious Diseases

## 2024-11-15 ENCOUNTER — Ambulatory Visit: Payer: MEDICAID | Attending: Infectious Diseases | Admitting: Infectious Diseases

## 2024-11-15 VITALS — BP 160/64 | HR 53 | Temp 98.4°F | Ht 76.0 in | Wt 183.0 lb

## 2024-11-15 DIAGNOSIS — R21 Rash and other nonspecific skin eruption: Secondary | ICD-10-CM | POA: Insufficient documentation

## 2024-11-15 DIAGNOSIS — R001 Bradycardia, unspecified: Secondary | ICD-10-CM | POA: Insufficient documentation

## 2024-11-15 DIAGNOSIS — Z87891 Personal history of nicotine dependence: Secondary | ICD-10-CM | POA: Insufficient documentation

## 2024-11-15 DIAGNOSIS — R5383 Other fatigue: Secondary | ICD-10-CM | POA: Insufficient documentation

## 2024-11-15 DIAGNOSIS — R519 Headache, unspecified: Secondary | ICD-10-CM | POA: Insufficient documentation

## 2024-11-15 DIAGNOSIS — L989 Disorder of the skin and subcutaneous tissue, unspecified: Secondary | ICD-10-CM | POA: Insufficient documentation

## 2024-11-15 DIAGNOSIS — K589 Irritable bowel syndrome without diarrhea: Secondary | ICD-10-CM | POA: Insufficient documentation

## 2024-11-15 DIAGNOSIS — Z79899 Other long term (current) drug therapy: Secondary | ICD-10-CM | POA: Insufficient documentation

## 2024-11-15 DIAGNOSIS — Z7982 Long term (current) use of aspirin: Secondary | ICD-10-CM | POA: Insufficient documentation

## 2024-11-15 DIAGNOSIS — R0981 Nasal congestion: Secondary | ICD-10-CM | POA: Insufficient documentation

## 2024-11-15 DIAGNOSIS — E860 Dehydration: Secondary | ICD-10-CM | POA: Insufficient documentation

## 2024-11-15 DIAGNOSIS — N4829 Other inflammatory disorders of penis: Secondary | ICD-10-CM | POA: Insufficient documentation

## 2024-11-15 DIAGNOSIS — Z22322 Carrier or suspected carrier of Methicillin resistant Staphylococcus aureus: Secondary | ICD-10-CM | POA: Insufficient documentation

## 2024-11-15 DIAGNOSIS — Z8619 Personal history of other infectious and parasitic diseases: Secondary | ICD-10-CM | POA: Insufficient documentation

## 2024-11-15 MED ORDER — MUPIROCIN 2 % EX OINT: 1.0000 | TOPICAL_OINTMENT | Freq: Two times a day (BID) | CUTANEOUS | 0 refills | Status: AC

## 2024-11-15 NOTE — Progress Notes (Signed)
 NAME: Shane Reynolds  DOB: [birth year]  MRN: [id number]  Date/Time: 11/15/2024 10:51 AM   Subjective:   Agreed to the use of AI scribe ? Shane Reynolds is a 68 year old male who presents with a severe skin reaction following doxycycline  use. He was referred by Dr. Selinda Quan for evaluation  MRSA colonization.  He initially developed a boil under his arm and several pimple-like lesions, including one in his nose, for which he was prescribed doxycycline  first week of Dec 2025. After starting doxycycline , he experienced extreme redness in the groin area, buttocks, and waist, with inflammation and skin peeling on his genitals. He was admitted to [facility] from December 13th to 16th and then to [facility] on December 19th for further evaluation. A culture taken at [facility] from the scrotal area showed no MRSA, only skin bacteria and Klebsiella. He was previously treated with IV antibiotics, Unasyn, and Zyvox  during his hospitalization at [facility]. He reports ongoing mild symptoms, including a couple of small lesions in the genital area, mild headaches, and nasal congestion. He has a follow-up dermatology appointment scheduled for February 5th.  He has a history of [facility] spotted fever 18 years ago and was recently tested for Lyme disease due to feeling lethargic and having been bitten by ticks multiple times. ; it was a direct WB test instead of EIA reflexed to WB the test showed one positive IgG marker, and 2 IgM. He experiences muscle quivering since 2000, which he associates with overexertion and dehydration. He was diagnosed with bradycardia and has a low heart rate, which has been evaluated and deemed normal for him. 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 He has a history of irritable bowel syndrome since the 1980s, which he manages.  He lives with his wife and is active, working on his son's property. He reports a history of multiple tick  bites and is knowledgeable about tick-borne diseases.   Past Medical History:  Diagnosis Date   Acid reflux    Anemia    Depression    Genital herpes    GERD (gastroesophageal reflux disease)    Hyperlipidemia    Hypertension    IBS (irritable bowel syndrome)    [facility] spotted fever     Past Surgical History:  Procedure Laterality Date   CHOLECYSTECTOMY     COLONOSCOPY WITH PROPOFOL  N/A 11/04/2023   Procedure: COLONOSCOPY WITH PROPOFOL ;  Surgeon: Toledo, Ladell POUR, MD;  Location: [facility] ENDOSCOPY;  Service: Gastroenterology;  Laterality: N/A;    Social History   Socioeconomic History   Marital status: Married    Spouse name: Not on file   Number of children: Not on file   Years of education: Not on file   Highest education level: Not on file  Occupational History   Occupation: ground maint  Tobacco Use   Smoking status: Former   Smokeless tobacco: Never  Substance and Sexual Activity   Alcohol use: No   Drug use: No   Sexual activity: Not on file  Other Topics Concern   Not on file  Social History Narrative   Not on file   Social Drivers of Health   Tobacco Use: Medium Risk (11/15/2024)   Patient History    Smoking Tobacco Use: Former    Smokeless Tobacco Use: Never    Passive Exposure: Not on file  Financial Resource Strain: Low Risk  (01/27/2024)   Received from [facility]   Overall Financial Resource Strain (CARDIA)    Difficulty of Paying Living Expenses: Not hard at all  Food Insecurity: No Food Insecurity (10/30/2024)   Epic    Worried About Programme Researcher, Broadcasting/film/video in the Last Year: Never true    Ran Out of Food in the Last Year: Never true  Transportation Needs: No Transportation Needs (10/30/2024)   Epic    Lack of Transportation (Medical): No    Lack of Transportation (Non-Medical): No  Physical Activity: Not on file  Stress: Not on file  Social Connections: Moderately Isolated (10/30/2024)   Social Connection and Isolation  Panel    Frequency of Communication with Friends and Family: More than three times a week    Frequency of Social Gatherings with Friends and Family: More than three times a week    Attends Religious Services: Never    Database Administrator or Organizations: No    Attends Banker Meetings: Never    Marital Status: Married  Catering Manager Violence: Not At Risk (10/30/2024)   Epic    Fear of Current or Ex-Partner: No    Emotionally Abused: No    Physically Abused: No    Sexually Abused: No  Depression (PHQ2-9): Not on file  Alcohol Screen: Not on file  Housing: Unknown (11/03/2024)   Received from [facility]   Epic    In the last 12 months, was there a time when you were not able to pay the mortgage or rent on time?: No    Number of Times Moved in the Last Year: Not on file    At any time in the past 12 months, were you homeless or living in a shelter (including now)?: No  Utilities: Not At Risk (10/30/2024)   Epic    Threatened with loss of utilities: No  Health Literacy: Not on file    Family History  Problem Relation Age of Onset   Diabetes Mother    Alcohol abuse Mother    Stroke Father    Skin cancer Sister    Colon cancer Neg Hx    Allergies[1] I? Current Outpatient Medications  Medication Sig Dispense Refill   acetaminophen  (TYLENOL ) 325 MG tablet Take 650 mg by mouth every 6 (six) hours as needed for mild pain (pain score 1-3).     aspirin  EC 81 MG tablet Take 81 mg by mouth daily.     dicyclomine  (BENTYL ) 20 MG tablet Take 1 tablet (20 mg total) by mouth 2 (two) times daily as needed for spasms. 20 tablet 0   diphenhydrAMINE  (BENADRYL ) 25 MG tablet Take 1 tablet (25 mg total) by mouth every 6 (six) hours. 12 tablet 0   Hyoscyamine  Sulfate SL 0.125 MG SUBL Place 0.125 mg under the tongue every 6 (six) hours as needed.     LORazepam  (ATIVAN ) 0.5 MG tablet Take 0.5 mg by mouth every 6 (six) hours as needed.      losartan -hydrochlorothiazide  (HYZAAR) 50-12.5 MG tablet Take 1 tablet by mouth daily.     omeprazole  (PRILOSEC) 20 MG capsule Take 1 capsule (20 mg total) by mouth daily. 30 capsule 0   rosuvastatin  (CRESTOR ) 10 MG tablet Take 10 mg by mouth daily.     No current facility-administered medications for this visit.     Abtx:  Anti-infectives (From admission, onward)    None       REVIEW OF SYSTEMS:  Const: negative fever, negative chills, negative weight loss, fatigue  Eyes: negative diplopia or visual changes, negative eye pain ENT: intermittent runny nose, negative sore throat Resp: negative cough, hemoptysis, dyspnea Cards: negative for chest pain, palpitations, lower extremity edema GU: negative for frequency, dysuria and hematuria GI: Negative for abdominal pain, diarrhea, bleeding, constipation Skin: negative for rash and pruritus Heme: negative for easy bruising and gum/nose bleeding MS: negative for myalgias, arthralgias, back pain and muscle weakness Neurolo:mild headache no dizziness, vertigo, memory problems  Psych: negative for feelings of anxiety, depression  Endocrine: negative for thyroid, diabetes Allergy/Immunology- Doxy Objective:  VITALS:  BP (!) 160/64   Pulse (!) 53   Temp 98.4 F (36.9 C) (Temporal)   Ht 6' 4 (1.93 m)   Wt 183 lb (83 kg)   SpO2 96%   BMI 22.28 kg/m   PHYSICAL EXAM:  General: Alert, cooperative, no distress, appears stated age.  Head: Normocephalic, without obvious abnormality, atraumatic. Eyes: Conjunctivae clear, anicteric sclerae. Pupils are equal ENT Nares normal. No drainage or sinus tenderness. Lips, mucosa, and tongue normal. No Thrush Neck: Supple, symmetrical, no adenopathy, thyroid: non tender no carotid bruit and no JVD. Back: No CVA tenderness. Lungs: Clear to auscultation bilaterally. No Wheezing or Rhonchi. No rales. Heart: Bradycardia Abdomen: Soft, non-tender,not distended. Bowel sounds normal. No  masses Extremities: atraumatic, no cyanosis. No edema. No clubbing Skin:genital- penile ulceration   Healed skin lesions Lymph: Cervical, supraclavicular normal. Neurologic: Grossly non-focal Pertinent Labs Lab Results CBC    Component Value Date/Time   WBC 8.1 10/30/2024 1823   RBC 4.31 10/30/2024 1823   HGB 14.5 10/30/2024 1823   HGB 14.8 11/20/2011 0946   HCT 42.2 10/30/2024 1823   HCT 42.2 11/20/2011 0946   PLT 222 10/30/2024 1823   PLT 196 11/20/2011 0946   MCV 97.9 10/30/2024 1823   MCV 97.8 11/20/2011 0946   MCH 33.6 10/30/2024 1823   MCHC 34.4 10/30/2024 1823   RDW 11.9 10/30/2024 1823   RDW 12.5 11/20/2011 0946   LYMPHSABS 0.9 10/30/2024 1823   LYMPHSABS 2.6 11/20/2011 0946   MONOABS 0.2 10/30/2024 1823   MONOABS 0.5 11/20/2011 0946   EOSABS 0.0 10/30/2024 1823   EOSABS 0.3 11/20/2011 0946   BASOSABS 0.0 10/30/2024 1823   BASOSABS 0.1 11/20/2011 0946       Latest Ref Rng & Units 10/31/2024    4:25 AM 10/30/2024   12:13 AM 01/02/2021    4:49 PM  CMP  Glucose 70 - 99 mg/dL 867  95  873   BUN 8 - 23 mg/dL 28  22  23    Creatinine 0.61 - 1.24 mg/dL 9.02  9.07  8.83   Sodium 135 - 145 mmol/L 137  138  135   Potassium 3.5 - 5.1 mmol/L 4.0  3.9  4.8   Chloride 98 - 111 mmol/L 102  102  99   CO2 22 - 32 mmol/L 22  22  25    Calcium  8.9 - 10.3 mg/dL 9.7  9.8  89.5   Total Protein 6.5 - 8.1 g/dL  7.5  7.9   Total Bilirubin 0.0 - 1.2 mg/dL  0.8  1.4   Alkaline Phos 38 - 126 U/L  55  45   AST 15 - 41 U/L  24  28   ALT 0 - 44 U/L  24  25       Microbiology: 11/04/24 WC- kleb, citrobacter and cuti  IMAGING RESULTS: I have personally reviewed the films ? Impression/Recommendation ?Bullous fixed drug eruption due to doxycycline  ?  SJS  Severe reaction to doxycycline  characterized by extreme redness in the groin area, inflammation of the penis and scrotum, and blistering skin. Hospitalized and treated with steroids. Reaction consistent with bullous fixed drug  eruption. Most areas have cleared, but some residual red spots remain in the genital area. - Avoid doxycycline  in the future - Continue using topical steroids as recommended  Genital herpes simplex virus infection (suspected) Suspected genital herpes simplex virus infection due to penile rash and history of previous outbreaks. Differential diagnosis includes herpes simplex virus infection, especially given the presence of blistering rash. 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 No oral antibiotics  required   - Use mupirocin  cream in the nose - will hold off CHG wash till the skin lesions heal completely  H/o  folliculitis left axilla- but no cultures done- has resolved  Fatigue, mild headache H/o tick bites Positive RMSF IgG Lyme WB IgM =- will check EIA with reflex WB Also will check RPR  Gave h/o positive hepc antibody but in 2013 HEPC RNA neg  Will let him know of test results. Follow PRN? ? _Discussed the management in great detail with the patient  ________________________________________________      [1]  Allergies Allergen Reactions   Doxycycline  Dermatitis   Codeine Nausea And Vomiting   Vancomycin  Dermatitis    Red man   Sulfa Antibiotics Rash   Sulfamethoxazole-Trimethoprim Dermatitis and Other (See Comments)    Breaking out with bumps on face

## 2024-11-15 NOTE — Patient Instructions (Signed)
 You came in today due to a severe skin reaction after taking doxycycline , which was initially prescribed for a boil and pimple-like lesions. You experienced extreme redness, inflammation, and skin peeling in the groin area, buttocks, and waist. You were hospitalized and treated with IV antibiotics. Cultures showed no MRSA but did show skin bacteria and Klebsiella. You have a follow-up dermatology appointment scheduled for February 5th. We also discussed your history of tick bites, muscle quivering, bradycardia, hepatitis C, and irritable bowel syndrome.  YOUR PLAN:  -BULLOUS FIXED DRUG ERUPTION DUE TO DOXYCYCLINE : This is a severe skin reaction to doxycycline , characterized by extreme redness, inflammation, and blistering. You should avoid doxycycline  in the future and continue using topical steroids as recommended.  -GENITAL HERPES SIMPLEX VIRUS INFECTION (SUSPECTED): This is a suspected viral infection that causes blistering rashes. We performed an HSV PCR swab to test for herpes simplex virus infection.  -MRSA NASAL COLONIZATION: This is the presence of MRSA bacteria in your nose. No current treatment is required, but youcan  use mupirocin  cream twice a day  in your nose INSTRUCTIONS:   Continue using topical steroids as recommended. Use mupirocin  cream in your noseFollow up with dermatology on February 5th. We will contact you with the results of the HSV PCR swab test.and RPR and lyme

## 2024-11-16 LAB — LYME DISEASE SEROLOGY W/REFLEX: Lyme Total Antibody EIA: NEGATIVE

## 2024-11-16 LAB — SYPHILIS: RPR W/REFLEX TO RPR TITER AND TREPONEMAL ANTIBODIES, TRADITIONAL SCREENING AND DIAGNOSIS ALGORITHM
RPR Ser Ql: REACTIVE — AB
RPR Titer: 1:1 {titer}

## 2024-11-17 LAB — T.PALLIDUM AB, TOTAL: T Pallidum Abs: NONREACTIVE

## 2024-11-18 LAB — HSV DNA BY PCR (REFERENCE LAB)
HSV 1 DNA: NEGATIVE
HSV 2 DNA: NEGATIVE

## 2024-11-24 ENCOUNTER — Ambulatory Visit: Payer: Self-pay

## 2024-12-13 ENCOUNTER — Other Ambulatory Visit
Admission: RE | Admit: 2024-12-13 | Discharge: 2024-12-13 | Disposition: A | Discharge status: Home / Self Care | Payer: Self-pay | Attending: Infectious Diseases | Admitting: Infectious Diseases

## 2024-12-13 ENCOUNTER — Ambulatory Visit: Payer: Self-pay | Attending: Infectious Diseases | Admitting: Infectious Diseases

## 2024-12-13 VITALS — BP 172/99 | HR 55 | Temp 97.9°F | Wt 185.0 lb

## 2024-12-13 DIAGNOSIS — R6889 Other general symptoms and signs: Secondary | ICD-10-CM | POA: Insufficient documentation

## 2024-12-13 DIAGNOSIS — Z22322 Carrier or suspected carrier of Methicillin resistant Staphylococcus aureus: Secondary | ICD-10-CM | POA: Insufficient documentation

## 2024-12-13 DIAGNOSIS — Z881 Allergy status to other antibiotic agents status: Secondary | ICD-10-CM | POA: Insufficient documentation

## 2024-12-13 DIAGNOSIS — R5383 Other fatigue: Secondary | ICD-10-CM | POA: Insufficient documentation

## 2024-12-13 DIAGNOSIS — G8929 Other chronic pain: Secondary | ICD-10-CM | POA: Insufficient documentation

## 2024-12-13 DIAGNOSIS — R519 Headache, unspecified: Secondary | ICD-10-CM | POA: Insufficient documentation

## 2024-12-13 DIAGNOSIS — B948 Sequelae of other specified infectious and parasitic diseases: Secondary | ICD-10-CM | POA: Insufficient documentation

## 2024-12-13 DIAGNOSIS — R591 Generalized enlarged lymph nodes: Secondary | ICD-10-CM | POA: Insufficient documentation

## 2024-12-13 DIAGNOSIS — W57XXXS Bitten or stung by nonvenomous insect and other nonvenomous arthropods, sequela: Secondary | ICD-10-CM | POA: Insufficient documentation

## 2024-12-13 LAB — CBC WITH DIFFERENTIAL/PLATELET
Abs Immature Granulocytes: 0.03 10*3/uL (ref 0.00–0.07)
Basophils Absolute: 0.1 10*3/uL (ref 0.0–0.1)
Basophils Relative: 1 %
Eosinophils Absolute: 0.2 10*3/uL (ref 0.0–0.5)
Eosinophils Relative: 2 %
HCT: 38.6 % — ABNORMAL LOW (ref 39.0–52.0)
Hemoglobin: 13.2 g/dL (ref 13.0–17.0)
Immature Granulocytes: 0 %
Lymphocytes Relative: 19 %
Lymphs Abs: 1.5 10*3/uL (ref 0.7–4.0)
MCH: 33.1 pg (ref 26.0–34.0)
MCHC: 34.2 g/dL (ref 30.0–36.0)
MCV: 96.7 fL (ref 80.0–100.0)
Monocytes Absolute: 0.7 10*3/uL (ref 0.1–1.0)
Monocytes Relative: 9 %
Neutro Abs: 5.6 10*3/uL (ref 1.7–7.7)
Neutrophils Relative %: 69 %
Platelets: 198 10*3/uL (ref 150–400)
RBC: 3.99 MIL/uL — ABNORMAL LOW (ref 4.22–5.81)
RDW: 12 % (ref 11.5–15.5)
WBC: 8 10*3/uL (ref 4.0–10.5)
nRBC: 0 % (ref 0.0–0.2)

## 2024-12-13 LAB — COMPREHENSIVE METABOLIC PANEL WITH GFR
ALT: 25 U/L (ref 0–44)
AST: 26 U/L (ref 15–41)
Albumin: 4.3 g/dL (ref 3.5–5.0)
Alkaline Phosphatase: 56 U/L (ref 38–126)
Anion gap: 10 (ref 5–15)
BUN: 22 mg/dL (ref 8–23)
CO2: 26 mmol/L (ref 22–32)
Calcium: 10.1 mg/dL (ref 8.9–10.3)
Chloride: 104 mmol/L (ref 98–111)
Creatinine, Ser: 0.94 mg/dL (ref 0.61–1.24)
GFR, Estimated: >60 mL/min
Glucose, Bld: 102 mg/dL — ABNORMAL HIGH (ref 70–99)
Potassium: 4.3 mmol/L (ref 3.5–5.1)
Sodium: 139 mmol/L (ref 135–145)
Total Bilirubin: 0.8 mg/dL (ref 0.0–1.2)
Total Protein: 7.5 g/dL (ref 6.5–8.1)

## 2024-12-13 NOTE — Patient Instructions (Addendum)
 You are here for left cervical lymphnode painful- you had a neft nostril boil which has almost resolved - now- will do a few test and let your PCP know so that you could follow

## 2024-12-15 LAB — AEROBIC CULTURE W GRAM STAIN (SUPERFICIAL SPECIMEN)
Culture: NORMAL
Gram Stain: NONE SEEN

## 2024-12-15 LAB — SPOTTED FEVER GROUP ANTIBODIES
Spotted Fever Group IgG: 1:256 {titer} — ABNORMAL HIGH
Spotted Fever Group IgM: <1:64 {titer}

## 2024-12-16 ENCOUNTER — Ambulatory Visit: Payer: Self-pay | Admitting: Infectious Diseases

## 2024-12-19 ENCOUNTER — Emergency Department: Payer: Self-pay

## 2024-12-19 ENCOUNTER — Other Ambulatory Visit: Payer: Self-pay

## 2024-12-19 ENCOUNTER — Emergency Department
Admission: EM | Admit: 2024-12-19 | Discharge: 2024-12-19 | Disposition: A | Discharge status: Home / Self Care | Payer: Self-pay

## 2024-12-19 DIAGNOSIS — L03221 Cellulitis of neck: Secondary | ICD-10-CM | POA: Insufficient documentation

## 2024-12-19 DIAGNOSIS — R599 Enlarged lymph nodes, unspecified: Secondary | ICD-10-CM | POA: Insufficient documentation

## 2024-12-19 LAB — CBC
HCT: 41.4 % (ref 39.0–52.0)
Hemoglobin: 14.3 g/dL (ref 13.0–17.0)
MCH: 33.2 pg (ref 26.0–34.0)
MCHC: 34.5 g/dL (ref 30.0–36.0)
MCV: 96.1 fL (ref 80.0–100.0)
Platelets: 239 10*3/uL (ref 150–400)
RBC: 4.31 MIL/uL (ref 4.22–5.81)
RDW: 12 % (ref 11.5–15.5)
WBC: 12.6 10*3/uL — ABNORMAL HIGH (ref 4.0–10.5)
nRBC: 0 % (ref 0.0–0.2)

## 2024-12-19 LAB — BASIC METABOLIC PANEL WITH GFR
Anion gap: 13 (ref 5–15)
BUN: 24 mg/dL — ABNORMAL HIGH (ref 8–23)
CO2: 22 mmol/L (ref 22–32)
Calcium: 10.3 mg/dL (ref 8.9–10.3)
Chloride: 103 mmol/L (ref 98–111)
Creatinine, Ser: 1 mg/dL (ref 0.61–1.24)
GFR, Estimated: >60 mL/min
Glucose, Bld: 114 mg/dL — ABNORMAL HIGH (ref 70–99)
Potassium: 4.4 mmol/L (ref 3.5–5.1)
Sodium: 138 mmol/L (ref 135–145)

## 2024-12-19 LAB — TSH: TSH: 1.76 u[IU]/mL (ref 0.350–4.500)

## 2024-12-19 MED ORDER — AMOXICILLIN-POT CLAVULANATE 875-125 MG PO TABS: 1.0000 | ORAL_TABLET | Freq: Two times a day (BID) | ORAL | 0 refills | Status: AC

## 2024-12-19 MED ORDER — AMOXICILLIN-POT CLAVULANATE 875-125 MG PO TABS
1.0000 | ORAL_TABLET | Freq: Once | ORAL | Status: AC
  Administered 2024-12-19: 1 via ORAL
  Filled 2024-12-19: qty 1

## 2024-12-19 MED ORDER — IOHEXOL 300 MG/ML  SOLN
75.0000 mL | Freq: Once | INTRAMUSCULAR | Status: AC | PRN
  Administered 2024-12-19: 75 mL via INTRAVENOUS

## 2024-12-19 MED ORDER — AMOXICILLIN-POT CLAVULANATE 875-125 MG PO TABS: 1.0000 | ORAL_TABLET | Freq: Two times a day (BID) | ORAL | 0 refills | Status: DC

## 2024-12-19 MED ORDER — IBUPROFEN 600 MG PO TABS
600.0000 mg | ORAL_TABLET | Freq: Once | ORAL | Status: AC
  Administered 2024-12-19: 600 mg via ORAL
  Filled 2024-12-19: qty 1

## 2024-12-19 NOTE — Discharge Instructions (Addendum)
 You were seen today due to concern of neck swelling.  At this time we are concerned that this may be secondary to an infection of the skin, I have written for some antibiotics to take, please take these as instructed.  I would recommend following up with with your infectious disease team for reassessment.  If you continue having swelling along the neck or continues to feel the lymph node after completing the antibiotics, I would recommend calling the oncology team using the phone number I provided to arrange for a follow-up appointment to discuss further evaluation of your enlarged lymph node.  If you start having any difficulty breathing, difficulty swallowing, change in voice, or any other symptoms you find concerning please return to the emergency department immediately for further medical management.

## 2024-12-19 NOTE — ED Provider Notes (Signed)
 "  [facility] Provider Note    Event Date/Time   First MD Initiated Contact with Patient 12/19/24 1048     (approximate)   History   lymph node swelling   HPI  Shane Reynolds is a 69 y.o. male presenting with concern of neck swelling.  Patient with recent complicated hospitalization with drug rash reaction in late December, since then rash has resolved and he has been feeling better, about 2 weeks ago he developed some left-sided neck swelling, initially was some irritation along his left side of his neck but has progressively increased in swelling since the initial onset, now describes as a golf ball sized lump on his left neck.  He has not had any fevers difficulty swallowing or voice changes limited neck mobility or any other symptoms.  He has not noticed any gum swelling or any other complaints or symptoms.  He went to see infectious disease for this, he was told that this is likely a swollen lymph node, and to continue monitoring symptoms of this.  Given the continued swelling decided to come into the emergency department for further assessment and evaluation.     Physical Exam   Triage Vital Signs: ED Triage Vitals [12/19/24 1027]  Encounter Vitals Group     BP (!) 174/92     Girls Systolic BP Percentile      Girls Diastolic BP Percentile      Boys Systolic BP Percentile      Boys Diastolic BP Percentile      Pulse Rate 60     Resp 16     Temp 98 F (36.7 C)     Temp Source Oral     SpO2 99 %     Weight 184 lb 15.5 oz (83.9 kg)     Height      Head Circumference      Peak Flow      Pain Score 0     Pain Loc      Pain Education      Exclude from Growth Chart     Most recent vital signs: Vitals:   12/19/24 1027 12/19/24 1439  BP: (!) 174/92 (!) 140/79  Pulse: 60 (!) 55  Resp: 16 17  Temp: 98 F (36.7 C) (!) 97.4 F (36.3 C)  SpO2: 99% 96%     General: Awake, no distress.  Neck:  Hard left-sided neck mass, nonmobile no  significant erythema warmth to touch or pain appreciated, I do not appreciate any swelling within the oropharynx, gumline is appropriate  CV:  Good peripheral perfusion.  Resp:  Normal effort.  Speaking full sentences no evidence of respiratory distress Abd:  No distention.  Soft nontender Other:     ED Results / Procedures / Treatments   Labs (all labs ordered are listed, but only abnormal results are displayed) Labs Reviewed  CBC - Abnormal; Notable for the following components:      Result Value   WBC 12.6 (*)    All other components within normal limits  BASIC METABOLIC PANEL WITH GFR - Abnormal; Notable for the following components:   Glucose, Bld 114 (*)    BUN 24 (*)    All other components within normal limits  TSH     EKG     RADIOLOGY   PROCEDURES:  Critical Care performed: No  Procedures   MEDICATIONS ORDERED IN ED: Medications  iohexol  (OMNIPAQUE ) 300 MG/ML solution 75 mL (75 mLs Intravenous Contrast Given  12/19/24 1315)  amoxicillin -clavulanate (AUGMENTIN ) 875-125 MG per tablet 1 tablet (1 tablet Oral Given 12/19/24 1436)  ibuprofen  (ADVIL ) tablet 600 mg (600 mg Oral Given 12/19/24 1436)     IMPRESSION / MDM / ASSESSMENT AND PLAN / ED COURSE  I reviewed the triage vital signs and the nursing notes.                               Patient's presentation is most consistent with acute complicated illness / injury requiring diagnostic workup.  69 year old male presenting with left-sided neck swelling, appears to have a hardened mass along the left side of his neck.  Considering possible sialoadenitis versus enlarged lymph node versus goiter, versus possibly a deep space infection but clinically I feel that this is less likely.  Obtaining labs and CT imaging of the neck to further assess and differentiate etiology and determine appropriate disposition accordingly.   Clinical Course as of 12/19/24 1454  Mon Dec 19, 2024  1407 CT imaging was discussed with  Dr. Fayette agreed with plan to give dose of Augmentin  here, will have the patient discharged home at this time given the persistence of the swelling and the enlarged lymph node, I am providing him with contact information for oncology with instructions to follow-up with them if his swelling were to continue after completion of the antibiotics.  I discussed return precautions including signs concerning for airway compromise.. [SK]    Clinical Course User Index [SK] Fernand Rossie HERO, MD     FINAL CLINICAL IMPRESSION(S) / ED DIAGNOSES   Final diagnoses:  Cellulitis of neck  Enlarged lymph node     Rx / DC Orders   ED Discharge Orders          Ordered    amoxicillin -clavulanate (AUGMENTIN ) 875-125 MG tablet  2 times daily        12/19/24 1409             Note:  This document was prepared using Dragon voice recognition software and may include unintentional dictation errors.   Fernand Rossie HERO, MD 12/19/24 1454  "

## 2024-12-19 NOTE — ED Notes (Signed)
 Pt standing in room, sig other at bedside, pt states that he is feeling better and ready to go home, read and reviewed d/c instructions and follow up, reviewed script, pt ambulatory from department.
